# Patient Record
Sex: Female | Born: 1965 | Race: White | Hispanic: No | Marital: Married | Smoking: Never smoker
Health system: Southern US, Community
[De-identification: ages and names within clinical notes are randomized; demographics above are authoritative.]

## PROBLEM LIST (undated history)

## (undated) DIAGNOSIS — T7840XA Allergy, unspecified, initial encounter: Secondary | ICD-10-CM

## (undated) DIAGNOSIS — K219 Gastro-esophageal reflux disease without esophagitis: Secondary | ICD-10-CM

## (undated) DIAGNOSIS — C801 Malignant (primary) neoplasm, unspecified: Secondary | ICD-10-CM

## (undated) DIAGNOSIS — E785 Hyperlipidemia, unspecified: Secondary | ICD-10-CM

## (undated) HISTORY — DX: Gastro-esophageal reflux disease without esophagitis: K21.9

## (undated) HISTORY — DX: Allergy, unspecified, initial encounter: T78.40XA

## (undated) HISTORY — DX: Hyperlipidemia, unspecified: E78.5

## (undated) HISTORY — DX: Malignant (primary) neoplasm, unspecified: C80.1

---

## 2008-11-10 HISTORY — PX: SKIN CANCER EXCISION: SHX779

## 2013-07-06 ENCOUNTER — Other Ambulatory Visit: Payer: Self-pay | Admitting: Nurse Practitioner

## 2013-07-07 NOTE — Telephone Encounter (Signed)
Last seen 12/11/12  [facility] 

## 2013-07-28 ENCOUNTER — Telehealth: Payer: Self-pay | Admitting: Nurse Practitioner

## 2013-07-28 ENCOUNTER — Other Ambulatory Visit: Payer: Self-pay | Admitting: Nurse Practitioner

## 2013-07-28 MED ORDER — HYDROXYZINE HCL 25 MG PO TABS: 25.0000 mg | ORAL_TABLET | Freq: Three times a day (TID) | ORAL | Status: DC | PRN

## 2013-07-28 NOTE — Telephone Encounter (Signed)
Will send in rx for hydroxizine- can make you sleepy

## 2013-07-28 NOTE — Telephone Encounter (Signed)
Patient aware.

## 2013-08-08 ENCOUNTER — Other Ambulatory Visit: Payer: Self-pay

## 2013-08-08 MED ORDER — LORATADINE 10 MG PO TABS: 10.0000 mg | ORAL_TABLET | Freq: Every day | ORAL | Status: DC

## 2013-08-08 NOTE — Telephone Encounter (Signed)
Last seen 12/11/12  [facility]

## 2013-09-07 ENCOUNTER — Other Ambulatory Visit: Payer: Self-pay | Admitting: *Deleted

## 2013-09-07 MED ORDER — LORATADINE 10 MG PO TABS: 10.0000 mg | ORAL_TABLET | Freq: Every day | ORAL | Status: DC

## 2013-09-07 NOTE — Telephone Encounter (Signed)
LAST OV 2/14

## 2013-10-11 ENCOUNTER — Other Ambulatory Visit: Payer: Self-pay | Admitting: Nurse Practitioner

## 2013-10-13 NOTE — Telephone Encounter (Signed)
Last seen by [facility] 02/14

## 2013-11-12 ENCOUNTER — Other Ambulatory Visit: Payer: Self-pay | Admitting: Nurse Practitioner

## 2013-11-18 ENCOUNTER — Other Ambulatory Visit: Payer: Self-pay | Admitting: Nurse Practitioner

## 2013-11-21 ENCOUNTER — Encounter (INDEPENDENT_AMBULATORY_CARE_PROVIDER_SITE_OTHER): Payer: Self-pay

## 2013-11-21 ENCOUNTER — Ambulatory Visit (INDEPENDENT_AMBULATORY_CARE_PROVIDER_SITE_OTHER): Admitting: Family Medicine

## 2013-11-21 ENCOUNTER — Encounter: Payer: Self-pay | Admitting: Family Medicine

## 2013-11-21 VITALS — BP 120/81 | HR 88 | Temp 98.7°F | Wt 142.4 lb

## 2013-11-21 DIAGNOSIS — J029 Acute pharyngitis, unspecified: Secondary | ICD-10-CM

## 2013-11-21 DIAGNOSIS — J209 Acute bronchitis, unspecified: Secondary | ICD-10-CM

## 2013-11-21 DIAGNOSIS — R05 Cough: Secondary | ICD-10-CM

## 2013-11-21 DIAGNOSIS — R059 Cough, unspecified: Secondary | ICD-10-CM

## 2013-11-21 LAB — POCT INFLUENZA A/B
Influenza A, POC: NEGATIVE
Influenza B, POC: NEGATIVE

## 2013-11-21 LAB — POCT RAPID STREP A (OFFICE): Rapid Strep A Screen: NEGATIVE

## 2013-11-21 MED ORDER — GUAIFENESIN-CODEINE 100-10 MG/5ML PO SYRP: 5.0000 mL | ORAL_SOLUTION | Freq: Three times a day (TID) | ORAL | Status: DC | PRN

## 2013-11-21 MED ORDER — AZITHROMYCIN 250 MG PO TABS: ORAL_TABLET | ORAL | Status: DC

## 2013-11-21 NOTE — Patient Instructions (Signed)
Acute Bronchitis Bronchitis is inflammation of the airways that extend from the windpipe into the lungs (bronchi). The inflammation often causes mucus to develop. This leads to a cough, which is the most common symptom of bronchitis.  In acute bronchitis, the condition usually develops suddenly and goes away over time, usually in a couple weeks. Smoking, allergies, and asthma can make bronchitis worse. Repeated episodes of bronchitis may cause further lung problems.  CAUSES Acute bronchitis is most often caused by the same virus that causes a cold. The virus can spread from person to person (contagious).  SIGNS AND SYMPTOMS   Cough.   Fever.   Coughing up mucus.   Body aches.   Chest congestion.   Chills.   Shortness of breath.   Sore throat.  DIAGNOSIS  Acute bronchitis is usually diagnosed through a physical exam. Tests, such as chest X-rays, are sometimes done to rule out other conditions.  TREATMENT  Acute bronchitis usually goes away in a couple weeks. Often times, no medical treatment is necessary. Medicines are sometimes given for relief of fever or cough. Antibiotics are usually not needed but may be prescribed in certain situations. In some cases, an inhaler may be recommended to help reduce shortness of breath and control the cough. A cool mist vaporizer may also be used to help thin bronchial secretions and make it easier to clear the chest.  HOME CARE INSTRUCTIONS  Get plenty of rest.   Drink enough fluids to keep your urine clear or pale yellow (unless you have a medical condition that requires fluid restriction). Increasing fluids may help thin your secretions and will prevent dehydration.   Only take over-the-counter or prescription medicines as directed by your health care provider.   Avoid smoking and secondhand smoke. Exposure to cigarette smoke or irritating chemicals will make bronchitis worse. If you are a smoker, consider using nicotine gum or skin  patches to help control withdrawal symptoms. Quitting smoking will help your lungs heal faster.   Reduce the chances of another bout of acute bronchitis by washing your hands frequently, avoiding people with cold symptoms, and trying not to touch your hands to your mouth, nose, or eyes.   Follow up with your health care provider as directed.  SEEK MEDICAL CARE IF: Your symptoms do not improve after 1 week of treatment.  SEEK IMMEDIATE MEDICAL CARE IF:  You develop an increased fever or chills.   You have chest pain.   You have severe shortness of breath.  You have bloody sputum.   You develop dehydration.  You develop fainting.  You develop repeated vomiting.  You develop a severe headache. MAKE SURE YOU:   Understand these instructions.  Will watch your condition.  Will get help right away if you are not doing well or get worse. Document Released: 12/04/2004 Document Revised: 06/29/2013 Document Reviewed: 04/19/2013 [employer] Patient Information 2014 [employer].  

## 2013-11-21 NOTE — Progress Notes (Signed)
Patient ID: Nicole Benitez, female   DOB: [birth year], 48 y.o.   MRN: [id number] SUBJECTIVE: CC: Chief Complaint  Patient presents with  . Acute Visit    cough sore throat     HPI: Was in the [place] hospital with her husband who went to get his  Dental care. Sore Throat Patient complains of sore throat. Associated symptoms include chest congestion, hoarseness, nasal blockage, post nasal drip, sinus and nasal congestion and sore throat. Onset of symptoms was 2 days ago, and have been gradually worsening since that time. She is drinking plenty of fluids. She has not had recent close exposure to someone with proven streptococcal pharyngitis. Doesn't smoke. Husband smokes but outside.   Past Medical History  Diagnosis Date  . Allergy     seasonal   Past Surgical History  Procedure Laterality Date  . Skin cancer excision Right 11-10-2008    skin cancer rt shoulder   History   Social History  . Marital Status: Married    Spouse Name: N/A    Number of Children: N/A  . Years of Education: N/A   Occupational History  . Not on file.   Social History Main Topics  . Smoking status: Never Smoker   . Smokeless tobacco: Not on file  . Alcohol Use: Not on file  . Drug Use: Not on file  . Sexual Activity: Not on file   Other Topics Concern  . Not on file   Social History Narrative  . No narrative on file   Family History  Problem Relation Age of Onset  . Hyperlipidemia Mother   . Hypertension Mother   . Cancer Father     back cancer  . Hypertension Brother    Current Outpatient Prescriptions on File Prior to Visit  Medication Sig Dispense Refill  . EQ ALLERGY RELIEF 10 MG tablet TAKE ONE TABLET BY MOUTH ONCE DAILY  30 tablet  0  . hydrOXYzine (ATARAX/VISTARIL) 25 MG tablet Take 1 tablet (25 mg total) by mouth 3 (three) times daily as needed for itching.  30 tablet  0   No current facility-administered medications on file prior to visit.   No Known Allergies  There is no  immunization history on file for this patient. Prior to Admission medications   Medication Sig Start Date End Date Taking? Authorizing Provider  EQ ALLERGY RELIEF 10 MG tablet TAKE ONE TABLET BY MOUTH ONCE DAILY 11/18/13  Yes Mary-Margaret Hassell Done, FNP  hydrOXYzine (ATARAX/VISTARIL) 25 MG tablet Take 1 tablet (25 mg total) by mouth 3 (three) times daily as needed for itching. 07/28/13  Yes Mary-Margaret Hassell Done, FNP     ROS: As above in the HPI. All other systems are stable or negative.  OBJECTIVE: APPEARANCE:  Patient in no acute distress.The patient appeared well nourished and normally developed. Acyanotic. Waist: VITAL SIGNS:BP 120/81  Pulse 88  Temp(Src) 98.7 F (37.1 C) (Oral)  Wt 142 lb 6.4 oz (64.592 kg)  SpO2 99%  LMP 10/31/2013 [facility]  SKIN: warm and  Dry without overt rashes, tattoos and scars  HEAD and Neck: without JVD, Head and scalp: normal Eyes:No scleral icterus. Fundi normal, eye movements normal. Ears: Auricle normal, canal normal, Tympanic membranes normal, insufflation normal. Nose: nasal congestion. Throat: red Neck & thyroid: normal  CHEST & LUNGS: Chest wall: normal Lungs: Coarse breath sounds. No Rales. No wheezes.   CVS: Reveals the PMI to be normally located. Regular rhythm, First and Second Heart sounds are normal,  absence of murmurs,  rubs or gallops. Peripheral vasculature: Radial pulses: normal Dorsal pedis pulses: normal Posterior pulses: normal  ABDOMEN:  Appearance: normal Benign, no organomegaly, no masses, no Abdominal Aortic enlargement. No Guarding , no rebound. No Bruits. Bowel sounds: normal  RECTAL: N/A GU: N/A  EXTREMETIES: nonedematous.  MUSCULOSKELETAL:  Spine: thoracolumbar scoliosis. With hump right thorax. Joints: intact  NEUROLOGIC: oriented to time,place and person; nonfocal. Strength is normal Sensory is normal Reflexes are normal Cranial Nerves are normal.  ASSESSMENT:  Sore throat - Plan: POCT Influenza  A/B, POCT rapid strep A  Cough - Plan: POCT Influenza A/B, POCT rapid strep A  Acute bronchitis - Plan: azithromycin (ZITHROMAX Z-PAK) 250 MG tablet, guaiFENesin-codeine (ROBITUSSIN AC) 100-10 MG/5ML syrup  PLAN: Hand out on bronchitis given Handwashing.   Orders Placed This Encounter  Procedures  . POCT Influenza A/B  . POCT rapid strep A   Results for orders placed in visit on 11/21/13  POCT INFLUENZA A/B      Result Value Range   Influenza A, POC Negative     Influenza B, POC Negative    POCT RAPID STREP A (OFFICE)      Result Value Range   Rapid Strep A Screen Negative  Negative    Meds ordered this encounter  Medications  . azithromycin (ZITHROMAX Z-PAK) 250 MG tablet    Sig: 2 tabs day 1 then 1 tab days 2 to 5    Dispense:  6 each    Refill:  0  . guaiFENesin-codeine (ROBITUSSIN AC) 100-10 MG/5ML syrup    Sig: Take 5 mLs by mouth 3 (three) times daily as needed for cough.    Dispense:  120 mL    Refill:  0   There are no discontinued medications. Return if symptoms worsen or fail to improve.  Zaelynn Fuchs P. Jacelyn Grip, M.D.

## 2013-12-27 ENCOUNTER — Other Ambulatory Visit: Payer: Self-pay | Admitting: Nurse Practitioner

## 2014-01-06 ENCOUNTER — Other Ambulatory Visit: Payer: Self-pay | Admitting: Nurse Practitioner

## 2014-01-16 ENCOUNTER — Other Ambulatory Visit: Payer: Self-pay

## 2014-01-16 MED ORDER — LORATADINE 10 MG PO TABS: 10.0000 mg | ORAL_TABLET | Freq: Every day | ORAL | Status: DC

## 2014-08-02 ENCOUNTER — Other Ambulatory Visit: Payer: Self-pay

## 2014-08-02 MED ORDER — LORATADINE 10 MG PO TABS: 10.0000 mg | ORAL_TABLET | Freq: Every day | ORAL | Status: DC

## 2014-08-02 NOTE — Telephone Encounter (Signed)
Last seen 11/21/13  [facility]

## 2014-09-06 ENCOUNTER — Other Ambulatory Visit: Payer: Self-pay | Admitting: Family Medicine

## 2014-09-07 NOTE — Telephone Encounter (Signed)
Last seen 11/21/13  [facility]

## 2014-10-15 ENCOUNTER — Other Ambulatory Visit: Payer: Self-pay | Admitting: Family Medicine

## 2014-10-16 NOTE — Telephone Encounter (Signed)
Last ov 1/15. 

## 2014-11-28 ENCOUNTER — Other Ambulatory Visit: Payer: Self-pay | Admitting: Family Medicine

## 2015-09-11 ENCOUNTER — Encounter: Payer: Self-pay | Admitting: Family

## 2015-09-11 ENCOUNTER — Ambulatory Visit (INDEPENDENT_AMBULATORY_CARE_PROVIDER_SITE_OTHER): Admitting: Family

## 2015-09-11 VITALS — BP 129/76 | HR 113 | Temp 97.9°F | Ht 64.0 in | Wt 132.6 lb

## 2015-09-11 DIAGNOSIS — N63 Unspecified lump in breast: Secondary | ICD-10-CM

## 2015-09-11 DIAGNOSIS — Z23 Encounter for immunization: Secondary | ICD-10-CM

## 2015-09-11 DIAGNOSIS — Z0000 Encounter for general adult medical examination without abnormal findings: Secondary | ICD-10-CM

## 2015-09-11 DIAGNOSIS — Z1211 Encounter for screening for malignant neoplasm of colon: Secondary | ICD-10-CM | POA: Diagnosis not present

## 2015-09-11 DIAGNOSIS — Z01419 Encounter for gynecological examination (general) (routine) without abnormal findings: Secondary | ICD-10-CM

## 2015-09-11 DIAGNOSIS — N6310 Unspecified lump in the right breast, unspecified quadrant: Secondary | ICD-10-CM

## 2015-09-11 DIAGNOSIS — Z1159 Encounter for screening for other viral diseases: Secondary | ICD-10-CM

## 2015-09-11 DIAGNOSIS — N926 Irregular menstruation, unspecified: Secondary | ICD-10-CM

## 2015-09-11 LAB — POCT URINALYSIS DIPSTICK
Bilirubin, UA: NEGATIVE
Blood, UA: NEGATIVE
GLUCOSE UA: NEGATIVE
Leukocytes, UA: NEGATIVE
NITRITE UA: NEGATIVE
Protein, UA: NEGATIVE
Spec Grav, UA: 1.025
UROBILINOGEN UA: NEGATIVE
pH, UA: 5

## 2015-09-11 LAB — POCT UA - MICROSCOPIC ONLY
Bacteria, U Microscopic: NEGATIVE
CRYSTALS, UR, HPF, POC: NEGATIVE
Casts, Ur, LPF, POC: NEGATIVE
RBC, urine, microscopic: NEGATIVE
WBC, UR, HPF, POC: NEGATIVE
YEAST UA: NEGATIVE

## 2015-09-11 NOTE — Progress Notes (Signed)
Subjective:    Patient ID: Nicole Benitez, female    DOB: September 02, 1966, 49 y.o.   MRN: [id number]  HPI Pt presents to the office today for CPE with pap. Pt currently not taking medications at this time. Pt states she has a knot on right breast. Pt states she hit her breast on a door about two weeks ago and since has noticed a "knot". Pt states she is having a slight discomfort in her right nipple of 1 out 10. Pt states she has a negative mammogram at the first of August. Pt states it is intermittent soreness. Pt denies any headache, palpitations, SOB, or edema at this time.     Review of Systems  Constitutional: Negative.   HENT: Negative.   Eyes: Negative.   Respiratory: Negative.  Negative for shortness of breath.   Cardiovascular: Negative.  Negative for palpitations.  Gastrointestinal: Negative.   Endocrine: Negative.   Genitourinary: Negative.   Musculoskeletal: Negative.   Neurological: Negative.  Negative for headaches.  Hematological: Negative.   Psychiatric/Behavioral: Negative.   All other systems reviewed and are negative.      Objective:   Physical Exam  Constitutional: She is oriented to person, place, and time. She appears well-developed and well-nourished. No distress.  HENT:  Head: Normocephalic and atraumatic.  Right Ear: External ear normal.  Left Ear: External ear normal.  Nose: Nose normal.  Mouth/Throat: Oropharynx is clear and moist.  Eyes: Pupils are equal, round, and reactive to light.  Neck: Normal range of motion. Neck supple. No thyromegaly present.  Cardiovascular: Normal rate, regular rhythm, normal heart sounds and intact distal pulses.   No murmur heard. Pulmonary/Chest: Effort normal and breath sounds normal. No respiratory distress. She has no wheezes. Right breast exhibits inverted nipple and mass (hard nonmoveable mass under right nipple). Right breast exhibits no nipple discharge, no skin change and no tenderness. Left breast exhibits no  inverted nipple, no mass, no nipple discharge, no skin change and no tenderness.  Abdominal: Soft. Bowel sounds are normal. She exhibits no distension. There is no tenderness.  Genitourinary: Vagina normal.  Bimanual exam- no adnexal masses or tenderness, ovaries nonpalpable   Cervix parous and pink- No discharge   Musculoskeletal: Normal range of motion. She exhibits no edema or tenderness.  Neurological: She is alert and oriented to person, place, and time. She has normal reflexes. No cranial nerve deficit.  Skin: Skin is warm and dry.  Psychiatric: She has a normal mood and affect. Her behavior is normal. Judgment and thought content normal.  Vitals reviewed.   BP 129/76 mmHg  Pulse 113  Temp(Src) 97.9 F (36.6 C) (Oral)  Ht 5' 4" (1.626 m)  Wt 132 lb 9.6 oz (60.147 kg)  BMI 22.75 kg/m2  LMP 09/04/2015 (Approximate)       Assessment & Plan:  1. Encounter for routine gynecological examination - POCT urinalysis dipstick - POCT UA - Microscopic Only - Pap IG w/ reflex to HPV when ASC-U  2. Annual physical exam - Anemia Profile B - CMP14+EGFR - Lipid panel - Thyroid Panel With TSH - Vit D  25 hydroxy (rtn osteoporosis monitoring) - Hepatitis C antibody - Pap IG w/ reflex to HPV when ASC-U  3. Need for hepatitis C screening test - Hepatitis C antibody  4. Irregular menses - FSH/LH  5. Encounter for screening colonoscopy - Ambulatory referral to Gastroenterology  6. Mass of breast, right - MM Digital Diagnostic Unilat R; Future   Continue all  meds Labs pending Health Maintenance reviewed- TDAP given today Diet and exercise encouraged RTO 1 year  Christy Hawks, FNP   

## 2015-09-11 NOTE — Patient Instructions (Signed)
Health Maintenance, Female Adopting a healthy lifestyle and getting preventive care can go a long way to promote health and wellness. Talk with your health care provider about what schedule of regular examinations is right for you. This is a good chance for you to check in with your provider about disease prevention and staying healthy. In between checkups, there are plenty of things you can do on your own. Experts have done a lot of research about which lifestyle changes and preventive measures are most likely to keep you healthy. Ask your health care provider for more information. WEIGHT AND DIET  Eat a healthy diet  Be sure to include plenty of vegetables, fruits, low-fat dairy products, and lean protein.  Do not eat a lot of foods high in solid fats, added sugars, or salt.  Get regular exercise. This is one of the most important things you can do for your health.  Most adults should exercise for at least 150 minutes each week. The exercise should increase your heart rate and make you sweat (moderate-intensity exercise).  Most adults should also do strengthening exercises at least twice a week. This is in addition to the moderate-intensity exercise.  Maintain a healthy weight  Body mass index (BMI) is a measurement that can be used to identify possible weight problems. It estimates body fat based on height and weight. Your health care provider can help determine your BMI and help you achieve or maintain a healthy weight.  For females 20 years of age and older:   A BMI below 18.5 is considered underweight.  A BMI of 18.5 to 24.9 is normal.  A BMI of 25 to 29.9 is considered overweight.  A BMI of 30 and above is considered obese.  Watch levels of cholesterol and blood lipids  You should start having your blood tested for lipids and cholesterol at 51 years of age, then have this test every 5 years.  You may need to have your cholesterol levels checked more often if:  Your lipid  or cholesterol levels are high.  You are older than 50 years of age.  You are at high risk for heart disease.  CANCER SCREENING   Lung Cancer  Lung cancer screening is recommended for adults 55-80 years old who are at high risk for lung cancer because of a history of smoking.  A yearly low-dose CT scan of the lungs is recommended for people who:  Currently smoke.  Have quit within the past 15 years.  Have at least a 30-pack-year history of smoking. A pack year is smoking an average of one pack of cigarettes a day for 1 year.  Yearly screening should continue until it has been 15 years since you quit.  Yearly screening should stop if you develop a health problem that would prevent you from having lung cancer treatment.  Breast Cancer  Practice breast self-awareness. This means understanding how your breasts normally appear and feel.  It also means doing regular breast self-exams. Let your health care provider know about any changes, no matter how small.  If you are in your 20s or 30s, you should have a clinical breast exam (CBE) by a health care provider every 1-3 years as part of a regular health exam.  If you are 40 or older, have a CBE every year. Also consider having a breast X-ray (mammogram) every year.  If you have a family history of breast cancer, talk to your health care provider about genetic screening.  If you   are at high risk for breast cancer, talk to your health care provider about having an MRI and a mammogram every year.  Breast cancer gene (BRCA) assessment is recommended for women who have family members with BRCA-related cancers. BRCA-related cancers include:  Breast.  Ovarian.  Tubal.  Peritoneal cancers.  Results of the assessment will determine the need for genetic counseling and BRCA1 and BRCA2 testing. Cervical Cancer Your health care provider may recommend that you be screened regularly for cancer of the pelvic organs (ovaries, uterus, and  vagina). This screening involves a pelvic examination, including checking for microscopic changes to the surface of your cervix (Pap test). You may be encouraged to have this screening done every 3 years, beginning at age 21.  For women ages 30-65, health care providers may recommend pelvic exams and Pap testing every 3 years, or they may recommend the Pap and pelvic exam, combined with testing for human papilloma virus (HPV), every 5 years. Some types of HPV increase your risk of cervical cancer. Testing for HPV may also be done on women of any age with unclear Pap test results.  Other health care providers may not recommend any screening for nonpregnant women who are considered low risk for pelvic cancer and who do not have symptoms. Ask your health care provider if a screening pelvic exam is right for you.  If you have had past treatment for cervical cancer or a condition that could lead to cancer, you need Pap tests and screening for cancer for at least 20 years after your treatment. If Pap tests have been discontinued, your risk factors (such as having a new sexual partner) need to be reassessed to determine if screening should resume. Some women have medical problems that increase the chance of getting cervical cancer. In these cases, your health care provider may recommend more frequent screening and Pap tests. Colorectal Cancer  This type of cancer can be detected and often prevented.  Routine colorectal cancer screening usually begins at 50 years of age and continues through 51 years of age.  Your health care provider may recommend screening at an earlier age if you have risk factors for colon cancer.  Your health care provider may also recommend using home test kits to check for hidden blood in the stool.  A small camera at the end of a tube can be used to examine your colon directly (sigmoidoscopy or colonoscopy). This is done to check for the earliest forms of colorectal  cancer.  Routine screening usually begins at age 50.  Direct examination of the colon should be repeated every 5-10 years through 51 years of age. However, you may need to be screened more often if early forms of precancerous polyps or small growths are found. Skin Cancer  Check your skin from head to toe regularly.  Tell your health care provider about any new moles or changes in moles, especially if there is a change in a mole's shape or color.  Also tell your health care provider if you have a mole that is larger than the size of a pencil eraser.  Always use sunscreen. Apply sunscreen liberally and repeatedly throughout the day.  Protect yourself by wearing long sleeves, pants, a wide-brimmed hat, and sunglasses whenever you are outside. HEART DISEASE, DIABETES, AND HIGH BLOOD PRESSURE   High blood pressure causes heart disease and increases the risk of stroke. High blood pressure is more likely to develop in:  People who have blood pressure in the high end   of the normal range (130-139/85-89 mm Hg).  People who are overweight or obese.  People who are African American.  If you are 38-23 years of age, have your blood pressure checked every 3-5 years. If you are 61 years of age or older, have your blood pressure checked every year. You should have your blood pressure measured twice--once when you are at a hospital or clinic, and once when you are not at a hospital or clinic. Record the average of the two measurements. To check your blood pressure when you are not at a hospital or clinic, you can use:  An automated blood pressure machine at a pharmacy.  A home blood pressure monitor.  If you are between 45 years and 39 years old, ask your health care provider if you should take aspirin to prevent strokes.  Have regular diabetes screenings. This involves taking a blood sample to check your fasting blood sugar level.  If you are at a normal weight and have a low risk for diabetes,  have this test once every three years after 51 years of age.  If you are overweight and have a high risk for diabetes, consider being tested at a younger age or more often. PREVENTING INFECTION  Hepatitis B  If you have a higher risk for hepatitis B, you should be screened for this virus. You are considered at high risk for hepatitis B if:  You were born in a country where hepatitis B is common. Ask your health care provider which countries are considered high risk.  Your parents were born in a high-risk country, and you have not been immunized against hepatitis B (hepatitis B vaccine).  You have HIV or AIDS.  You use needles to inject street drugs.  You live with someone who has hepatitis B.  You have had sex with someone who has hepatitis B.  You get hemodialysis treatment.  You take certain medicines for conditions, including cancer, organ transplantation, and autoimmune conditions. Hepatitis C  Blood testing is recommended for:  Everyone born from [birth year] through 1967.  Anyone with known risk factors for hepatitis C. Sexually transmitted infections (STIs)  You should be screened for sexually transmitted infections (STIs) including gonorrhea and chlamydia if:  You are sexually active and are younger than 51 years of age.  You are older than 51 years of age and your health care provider tells you that you are at risk for this type of infection.  Your sexual activity has changed since you were last screened and you are at an increased risk for chlamydia or gonorrhea. Ask your health care provider if you are at risk.  If you do not have HIV, but are at risk, it may be recommended that you take a prescription medicine daily to prevent HIV infection. This is called pre-exposure prophylaxis (PrEP). You are considered at risk if:  You are sexually active and do not regularly use condoms or know the HIV status of your partner(s).  You take drugs by injection.  You are sexually  active with a partner who has HIV. Talk with your health care provider about whether you are at high risk of being infected with HIV. If you choose to begin PrEP, you should first be tested for HIV. You should then be tested every 3 months for as long as you are taking PrEP.  PREGNANCY   If you are premenopausal and you may become pregnant, ask your health care provider about preconception counseling.  If you may  become pregnant, take 400 to 800 micrograms (mcg) of folic acid every day.  If you want to prevent pregnancy, talk to your health care provider about birth control (contraception). OSTEOPOROSIS AND MENOPAUSE   Osteoporosis is a disease in which the bones lose minerals and strength with aging. This can result in serious bone fractures. Your risk for osteoporosis can be identified using a bone density scan.  If you are 61 years of age or older, or if you are at risk for osteoporosis and fractures, ask your health care provider if you should be screened.  Ask your health care provider whether you should take a calcium or vitamin D supplement to lower your risk for osteoporosis.  Menopause may have certain physical symptoms and risks.  Hormone replacement therapy may reduce some of these symptoms and risks. Talk to your health care provider about whether hormone replacement therapy is right for you.  HOME CARE INSTRUCTIONS   Schedule regular health, dental, and eye exams.  Stay current with your immunizations.   Do not use any tobacco products including cigarettes, chewing tobacco, or electronic cigarettes.  If you are pregnant, do not drink alcohol.  If you are breastfeeding, limit how much and how often you drink alcohol.  Limit alcohol intake to no more than 1 drink per day for nonpregnant women. One drink equals 12 ounces of beer, 5 ounces of wine, or 1 ounces of hard liquor.  Do not use street drugs.  Do not share needles.  Ask your health care provider for help if  you need support or information about quitting drugs.  Tell your health care provider if you often feel depressed.  Tell your health care provider if you have ever been abused or do not feel safe at home.   This information is not intended to replace advice given to you by your health care provider. Make sure you discuss any questions you have with your health care provider.   Document Released: 05/12/2011 Document Revised: 11/17/2014 Document Reviewed: 09/28/2013 Elsevier Interactive Patient Education [employer].

## 2015-09-12 ENCOUNTER — Encounter: Payer: Self-pay | Admitting: Gastroenterology

## 2015-09-12 LAB — CMP14+EGFR
ALBUMIN: 4.9 g/dL (ref 3.5–5.5)
ALT: 17 IU/L (ref 0–32)
AST: 21 IU/L (ref 0–40)
Albumin/Globulin Ratio: 1.7 (ref 1.1–2.5)
Alkaline Phosphatase: 87 IU/L (ref 39–117)
BUN / CREAT RATIO: 18 (ref 9–23)
BUN: 14 mg/dL (ref 6–24)
Bilirubin Total: 0.5 mg/dL (ref 0.0–1.2)
CALCIUM: 10 mg/dL (ref 8.7–10.2)
CO2: 22 mmol/L (ref 18–29)
CREATININE: 0.76 mg/dL (ref 0.57–1.00)
Chloride: 99 mmol/L (ref 97–106)
GFR calc Af Amer: 105 mL/min/{1.73_m2} (ref >59)
GFR, EST NON AFRICAN AMERICAN: 91 mL/min/{1.73_m2} (ref >59)
GLOBULIN, TOTAL: 2.9 g/dL (ref 1.5–4.5)
Glucose: 103 mg/dL — ABNORMAL HIGH (ref 65–99)
Potassium: 4 mmol/L (ref 3.5–5.2)
SODIUM: 142 mmol/L (ref 136–144)
Total Protein: 7.8 g/dL (ref 6.0–8.5)

## 2015-09-12 LAB — LIPID PANEL
CHOL/HDL RATIO: 2.8 ratio (ref 0.0–4.4)
Cholesterol, Total: 220 mg/dL — ABNORMAL HIGH (ref 100–199)
HDL: 80 mg/dL (ref >39)
LDL CALC: 123 mg/dL — AB (ref 0–99)
TRIGLYCERIDES: 83 mg/dL (ref 0–149)
VLDL CHOLESTEROL CAL: 17 mg/dL (ref 5–40)

## 2015-09-12 LAB — ANEMIA PROFILE B
BASOS: 0 %
Basophils Absolute: 0 10*3/uL (ref 0.0–0.2)
EOS (ABSOLUTE): 0 10*3/uL (ref 0.0–0.4)
Eos: 0 %
Ferritin: 364 ng/mL — ABNORMAL HIGH (ref 15–150)
Folate: >20 ng/mL (ref >3.0)
HEMATOCRIT: 41.1 % (ref 34.0–46.6)
Hemoglobin: 14.5 g/dL (ref 11.1–15.9)
IMMATURE GRANS (ABS): 0 10*3/uL (ref 0.0–0.1)
IMMATURE GRANULOCYTES: 0 %
IRON SATURATION: 20 % (ref 15–55)
IRON: 57 ug/dL (ref 27–159)
Lymphocytes Absolute: 1.7 10*3/uL (ref 0.7–3.1)
Lymphs: 22 %
MCH: 34.4 pg — ABNORMAL HIGH (ref 26.6–33.0)
MCHC: 35.3 g/dL (ref 31.5–35.7)
MCV: 97 fL (ref 79–97)
Monocytes Absolute: 0.5 10*3/uL (ref 0.1–0.9)
Monocytes: 7 %
NEUTROS ABS: 5.5 10*3/uL (ref 1.4–7.0)
Neutrophils: 71 %
PLATELETS: 294 10*3/uL (ref 150–379)
RBC: 4.22 x10E6/uL (ref 3.77–5.28)
RDW: 12.9 % (ref 12.3–15.4)
RETIC CT PCT: 1.4 % (ref 0.6–2.6)
Total Iron Binding Capacity: 282 ug/dL (ref 250–450)
UIBC: 225 ug/dL (ref 131–425)
Vitamin B-12: 1008 pg/mL — ABNORMAL HIGH (ref 211–946)
WBC: 7.8 10*3/uL (ref 3.4–10.8)

## 2015-09-12 LAB — THYROID PANEL WITH TSH
FREE THYROXINE INDEX: 2.6 (ref 1.2–4.9)
T3 Uptake Ratio: 25 % (ref 24–39)
T4 TOTAL: 10.5 ug/dL (ref 4.5–12.0)
TSH: 1.65 u[IU]/mL (ref 0.450–4.500)

## 2015-09-12 LAB — VITAMIN D 25 HYDROXY (VIT D DEFICIENCY, FRACTURES): VIT D 25 HYDROXY: 25 ng/mL — AB (ref 30.0–100.0)

## 2015-09-12 LAB — FSH/LH
FSH: 69.4 m[IU]/mL
LH: 25.7 m[IU]/mL

## 2015-09-12 LAB — HEPATITIS C ANTIBODY: HEP C VIRUS AB: 1.7 {s_co_ratio} — AB (ref 0.0–0.9)

## 2015-09-13 ENCOUNTER — Other Ambulatory Visit: Payer: Self-pay | Admitting: Family

## 2015-09-13 DIAGNOSIS — N6310 Unspecified lump in the right breast, unspecified quadrant: Secondary | ICD-10-CM

## 2015-09-13 DIAGNOSIS — E785 Hyperlipidemia, unspecified: Secondary | ICD-10-CM

## 2015-09-13 DIAGNOSIS — R768 Other specified abnormal immunological findings in serum: Secondary | ICD-10-CM

## 2015-09-13 DIAGNOSIS — E559 Vitamin D deficiency, unspecified: Secondary | ICD-10-CM

## 2015-09-13 LAB — PAP IG W/ RFLX HPV ASCU: PAP SMEAR COMMENT: 0

## 2015-09-13 MED ORDER — SIMVASTATIN 20 MG PO TABS: 20.0000 mg | ORAL_TABLET | Freq: Every day | ORAL | Status: DC

## 2015-09-13 MED ORDER — VITAMIN D (ERGOCALCIFEROL) 1.25 MG (50000 UNIT) PO CAPS: 50000.0000 [IU] | ORAL_CAPSULE | ORAL | Status: DC

## 2015-09-18 ENCOUNTER — Ambulatory Visit
Admission: RE | Admit: 2015-09-18 | Discharge: 2015-09-18 | Disposition: A | Discharge status: Home / Self Care | Source: Ambulatory Visit | Attending: Family | Admitting: Family

## 2015-09-18 ENCOUNTER — Other Ambulatory Visit: Payer: Self-pay | Admitting: Family

## 2015-09-18 DIAGNOSIS — N6310 Unspecified lump in the right breast, unspecified quadrant: Secondary | ICD-10-CM

## 2015-09-19 ENCOUNTER — Telehealth: Payer: Self-pay | Admitting: Family

## 2015-09-19 DIAGNOSIS — R768 Other specified abnormal immunological findings in serum: Secondary | ICD-10-CM

## 2015-09-20 NOTE — Telephone Encounter (Signed)
Referral to GI sent for positive Hep C

## 2015-09-25 ENCOUNTER — Encounter (HOSPITAL_COMMUNITY): Payer: Self-pay

## 2015-09-28 ENCOUNTER — Telehealth: Payer: Self-pay | Admitting: Family Medicine

## 2015-10-03 NOTE — Telephone Encounter (Signed)
Referral placed and appointment made 

## 2015-11-06 ENCOUNTER — Ambulatory Visit (AMBULATORY_SURGERY_CENTER): Payer: TRICARE For Life (TFL) | Admitting: *Deleted

## 2015-11-06 VITALS — Ht 64.0 in | Wt 137.4 lb

## 2015-11-06 DIAGNOSIS — Z1211 Encounter for screening for malignant neoplasm of colon: Secondary | ICD-10-CM

## 2015-11-06 MED ORDER — NA SULFATE-K SULFATE-MG SULF 17.5-3.13-1.6 GM/177ML PO SOLN: 1.0000 | Freq: Once | ORAL | Status: DC

## 2015-11-06 NOTE — Progress Notes (Signed)
Unsure if mother had polyps. she will verify and let us know day of colon   No egg or soy allergy known to patient  No past sedation with any procedures- no past surgeries with sedation No diet pills per patient  No home 02 use per patient   emmi video to email        [email or web address]

## 2015-11-20 ENCOUNTER — Encounter: Payer: Self-pay | Admitting: Gastroenterology

## 2015-11-20 ENCOUNTER — Ambulatory Visit (AMBULATORY_SURGERY_CENTER): Admitting: Gastroenterology

## 2015-11-20 VITALS — BP 102/67 | HR 71 | Temp 98.2°F | Resp 17 | Ht 64.0 in | Wt 137.0 lb

## 2015-11-20 DIAGNOSIS — Z1211 Encounter for screening for malignant neoplasm of colon: Secondary | ICD-10-CM | POA: Diagnosis not present

## 2015-11-20 MED ORDER — SODIUM CHLORIDE 0.9 % IV SOLN: 500.0000 mL | INTRAVENOUS | Status: DC

## 2015-11-20 NOTE — Progress Notes (Signed)
Stable to RR 

## 2015-11-20 NOTE — Patient Instructions (Signed)
YOU HAD AN ENDOSCOPIC PROCEDURE TODAY AT THE [facility]:   Refer to the procedure report that was given to you for any specific questions about what was found during the examination.  If the procedure report does not answer your questions, please call your gastroenterologist to clarify.  If you requested that your care partner not be given the details of your procedure findings, then the procedure report has been included in a sealed envelope for you to review at your convenience later.  YOU SHOULD EXPECT: Some feelings of bloating in the abdomen. Passage of more gas than usual.  Walking can help get rid of the air that was put into your GI tract during the procedure and reduce the bloating. If you had a lower endoscopy (such as a colonoscopy or flexible sigmoidoscopy) you may notice spotting of blood in your stool or on the toilet paper. If you underwent a bowel prep for your procedure, you may not have a normal bowel movement for a few days.  Please Note:  You might notice some irritation and congestion in your nose or some drainage.  This is from the oxygen used during your procedure.  There is no need for concern and it should clear up in a day or so.  SYMPTOMS TO REPORT IMMEDIATELY:   Following lower endoscopy (colonoscopy or flexible sigmoidoscopy):  Excessive amounts of blood in the stool  Significant tenderness or worsening of abdominal pains  Swelling of the abdomen that is new, acute  Fever of 100F or higher   For urgent or emergent issues, a gastroenterologist can be reached at any hour by calling [phone].   DIET: Your first meal following the procedure should be a small meal and then it is ok to progress to your normal diet. Heavy or fried foods are harder to digest and may make you feel nauseous or bloated.  Likewise, meals heavy in dairy and vegetables can increase bloating.  Drink plenty of fluids but you should avoid alcoholic beverages for 24  hours.  ACTIVITY:  You should plan to take it easy for the rest of today and you should NOT DRIVE or use heavy machinery until tomorrow (because of the sedation medicines used during the test).    FOLLOW UP: Our staff will call the number listed on your records the next business day following your procedure to check on you and address any questions or concerns that you may have regarding the information given to you following your procedure. If we do not reach you, we will leave a message.  However, if you are feeling well and you are not experiencing any problems, there is no need to return our call.  We will assume that you have returned to your regular daily activities without incident.  If any biopsies were taken you will be contacted by phone or by letter within the next 1-3 weeks.  Please call us at [phone] if you have not heard about the biopsies in 3 weeks.    SIGNATURES/CONFIDENTIALITY: You and/or your care partner have signed paperwork which will be entered into your electronic medical record.  These signatures attest to the fact that that the information above on your After Visit Summary has been reviewed and is understood.  Full responsibility of the confidentiality of this discharge information lies with you and/or your care-partner.  Normal screening Repeat Colonoscopy in 10 years  

## 2015-11-20 NOTE — Op Note (Signed)
[place]  [address] [place], [id number]   COLONOSCOPY PROCEDURE REPORT  PATIENT: Nicole Benitez, Nicole Benitez  MR#: [id number] BIRTHDATE: 1966-10-18 , 49  yrs. old GENDER: female ENDOSCOPIST: Milus Banister, MD REFERRED DY:[id number] Laurance Flatten, M.D. PROCEDURE DATE:  11/20/2015 PROCEDURE:   Colonoscopy, screening First Screening Colonoscopy - Avg.  risk and is 50 yrs.  old or older Yes.  Prior Negative Screening - Now for repeat screening. N/A  History of Adenoma - Now for follow-up colonoscopy & has been > or = to 3 yrs.  N/A  Recommend repeat exam, <10 yrs? No ASA CLASS:   Class II INDICATIONS:Screening for colonic neoplasia and Colorectal Neoplasm Risk Assessment for this procedure is average risk. MEDICATIONS: Monitored anesthesia care, Propofol 200 mg IV, and lidocaine 40mg  IV  DESCRIPTION OF PROCEDURE:   After the risks benefits and alternatives of the procedure were thoroughly explained, informed consent was obtained.  The digital rectal exam revealed no abnormalities of the rectum.   The LB [id number]  endoscope was introduced through the anus and advanced to the cecum, which was identified by both the appendix and ileocecal valve. No adverse events experienced.   The quality of the prep was excellent.  The instrument was then slowly withdrawn as the colon was fully examined. Estimated blood loss is zero unless otherwise noted in this procedure report.      COLON FINDINGS: A normal appearing cecum, ileocecal valve, and appendiceal orifice were identified.  The ascending, transverse, descending, sigmoid colon, and rectum appeared unremarkable. Retroflexed views revealed no abnormalities. The time to cecum = 3.0 Withdrawal time = 5.3   The scope was withdrawn and the procedure completed. COMPLICATIONS: There were no immediate complications.  ENDOSCOPIC IMPRESSION: Normal colonoscopy  RECOMMENDATIONS: You should continue to follow colorectal cancer  screening guidelines for "routine risk" patients with a repeat colonoscopy in 10 years.   eSigned:  Milus Banister, MD 11/20/2015 11:13 AM

## 2015-11-21 ENCOUNTER — Telehealth: Payer: Self-pay

## 2015-11-21 NOTE — Telephone Encounter (Signed)
  Follow up Call-  Call back number 11/20/2015  Post procedure Call Back phone  # [phone]  Permission to leave phone message Yes     Patient questions:  Do you have a fever, pain , or abdominal swelling? No. Pain Score  0 *  Have you tolerated food without any problems? Yes.    Have you been able to return to your normal activities? Yes.    Do you have any questions about your discharge instructions: Diet   No. Medications  No. Follow up visit  No.  Do you have questions or concerns about your Care? No.  Actions: * If pain score is 4 or above: No action needed, pain <4.

## 2015-12-07 ENCOUNTER — Telehealth: Payer: Self-pay | Admitting: Family

## 2015-12-07 MED ORDER — ONDANSETRON HCL 4 MG PO TABS: 4.0000 mg | ORAL_TABLET | Freq: Three times a day (TID) | ORAL | Status: DC | PRN

## 2015-12-07 NOTE — Telephone Encounter (Signed)
Aware of new script and provider's suggestions.

## 2015-12-07 NOTE — Telephone Encounter (Signed)
Zofran Prescription sent to pharmacy, force fluids, bland diet

## 2016-01-14 ENCOUNTER — Ambulatory Visit: Admitting: Family Medicine

## 2016-01-15 ENCOUNTER — Encounter: Payer: Self-pay | Admitting: Family

## 2016-01-15 ENCOUNTER — Ambulatory Visit (INDEPENDENT_AMBULATORY_CARE_PROVIDER_SITE_OTHER): Admitting: Family

## 2016-01-15 ENCOUNTER — Encounter (INDEPENDENT_AMBULATORY_CARE_PROVIDER_SITE_OTHER): Payer: Self-pay

## 2016-01-15 VITALS — BP 118/79 | HR 98 | Temp 97.7°F | Ht 64.0 in | Wt 136.8 lb

## 2016-01-15 DIAGNOSIS — H1011 Acute atopic conjunctivitis, right eye: Secondary | ICD-10-CM | POA: Diagnosis not present

## 2016-01-15 DIAGNOSIS — Z23 Encounter for immunization: Secondary | ICD-10-CM

## 2016-01-15 DIAGNOSIS — J309 Allergic rhinitis, unspecified: Secondary | ICD-10-CM | POA: Diagnosis not present

## 2016-01-15 MED ORDER — OLOPATADINE HCL 0.2 % OP SOLN: 1.0000 [drp] | Freq: Every day | OPHTHALMIC | Status: DC

## 2016-01-15 MED ORDER — PREDNISONE 10 MG (21) PO TBPK
10.0000 mg | ORAL_TABLET | Freq: Every day | ORAL | Status: DC
Start: 2016-01-15 — End: 2016-03-25

## 2016-01-15 MED ORDER — FLUTICASONE PROPIONATE 50 MCG/ACT NA SUSP: 2.0000 | Freq: Every day | NASAL | Status: DC

## 2016-01-15 MED ORDER — CETIRIZINE HCL 10 MG PO TABS: 10.0000 mg | ORAL_TABLET | Freq: Every day | ORAL | Status: DC

## 2016-01-15 NOTE — Patient Instructions (Addendum)
Allergic Conjunctivitis Allergic conjunctivitis is inflammation of the clear membrane that covers the white part of your eye and the inner surface of your eyelid (conjunctiva), and it is caused by allergies. The blood vessels in the conjunctiva become inflamed, and this causes the eye to become red or pink, and it often causes itchiness in the eye. Allergic conjunctivitis cannot be spread by one person to another person (noncontagious). CAUSES This condition is caused by an allergic reaction. Common causes of an allergic reaction (allergens) include:  Dust.  Pollen.  Mold.  Animal dander or secretions. RISK FACTORS This condition is more likely to develop if you are exposed to high levels of allergens that cause the allergic reaction. This might include being outdoors when air pollen levels are high or being around animals that you are allergic to. SYMPTOMS Symptoms of this condition may include:  Eye redness.  Tearing of the eyes.  Watery eyes.  Itchy eyes.  Burning feeling in the eyes.  Clear drainage from the eyes.  Swollen eyelids. DIAGNOSIS This condition may be diagnosed by medical history and physical exam. If you have drainage from your eyes, it may be tested to rule out other causes of conjunctivitis. TREATMENT Treatment for this condition often includes medicines. These may be eye drops, ointments, or oral medicines. They may be prescription medicines or over-the-counter medicines. HOME CARE INSTRUCTIONS  Take or apply medicines only as directed by your health care provider.  Do not touch or rub your eyes.  Do not wear contact lenses until the inflammation is gone. Wear glasses instead.  Do not wear eye makeup until the inflammation is gone.  Apply a cool, clean washcloth to your eye for 10-20 minutes, 3-4 times a day.  Try to avoid whatever allergen is causing the allergic reaction. SEEK MEDICAL CARE IF:  Your symptoms get worse.  You have pus draining  from your eye.  You have new symptoms.  You have a fever.   This information is not intended to replace advice given to you by your health care provider. Make sure you discuss any questions you have with your health care provider.   Document Released: 01/17/2003 Document Revised: 11/17/2014 Document Reviewed: 08/08/2014 Elsevier Interactive Patient Education 2016 [employer]  

## 2016-01-15 NOTE — Addendum Note (Signed)
Addended by: Shelbie Ammons on: 01/15/2016 09:59 AM   Modules accepted: Orders

## 2016-01-15 NOTE — Progress Notes (Signed)
Subjective:    Patient ID: Nicole Benitez, female    DOB: May 17, 1966, 50 y.o.   MRN: [id number]  HPI Pt presents to the office today with right eye swelling. Pt states this started Saturday and seems to be unchanged. Pt states she has used a warm compresses that seem to have helped. PT states her allergies seem to be have been triggered this and seems to make the swelling worse. Pt denies any visual, gait, or speech changes. Pt states she has been taking a daily Claritin. Pt reports sneezing and a "scrartchy throat".     Review of Systems  Constitutional: Negative.   HENT: Negative.   Eyes: Negative.   Respiratory: Negative.  Negative for shortness of breath.   Cardiovascular: Negative.  Negative for palpitations.  Gastrointestinal: Negative.   Endocrine: Negative.   Genitourinary: Negative.   Musculoskeletal: Negative.   Neurological: Negative.  Negative for headaches.  Hematological: Negative.   Psychiatric/Behavioral: Negative.   All other systems reviewed and are negative.      Objective:   Physical Exam  Constitutional: She is oriented to person, place, and time. She appears well-developed and well-nourished. No distress.  HENT:  Head: Normocephalic and atraumatic.  Mouth/Throat: Oropharynx is clear and moist.  Eyes: Pupils are equal, round, and reactive to light.  Neck: Normal range of motion. Neck supple. No thyromegaly present.  Cardiovascular: Normal rate, regular rhythm, normal heart sounds and intact distal pulses.   No murmur heard. Pulmonary/Chest: Effort normal and breath sounds normal. No respiratory distress. She has no wheezes.  Abdominal: Soft. Bowel sounds are normal. She exhibits no distension. There is no tenderness.  Musculoskeletal: Normal range of motion. She exhibits no edema or tenderness.  Neurological: She is alert and oriented to person, place, and time. She has normal reflexes. No cranial nerve deficit.  Skin: Skin is warm and dry.  Psychiatric:  She has a normal mood and affect. Her behavior is normal. Judgment and thought content normal.  Vitals reviewed.   BP 118/79 mmHg  Pulse 98  Temp(Src) 97.7 F (36.5 C) (Oral)  Ht 5\' 4"  (1.626 m)  Wt 136 lb 12.8 oz (62.052 kg)  BMI 23.47 kg/m2       Assessment & Plan:  1. Allergic rhinitis, unspecified allergic rhinitis type - fluticasone (FLONASE) 50 MCG/ACT nasal spray; Place 2 sprays into both nostrils daily.  Dispense: 16 g; Refill: 6 - cetirizine (ZYRTEC) 10 MG tablet; Take 1 tablet (10 mg total) by mouth daily.  Dispense: 30 tablet; Refill: 11 - predniSONE (STERAPRED UNI-PAK 21 TAB) 10 MG (21) TBPK tablet; Take 1 tablet (10 mg total) by mouth daily. As directed x 6 days  Dispense: 21 tablet; Refill: 0 - Olopatadine HCl (PATADAY) 0.2 % SOLN; Apply 1 drop to eye daily.  Dispense: 2.5 mL; Refill: 6  2. Allergic conjunctivitis, right - fluticasone (FLONASE) 50 MCG/ACT nasal spray; Place 2 sprays into both nostrils daily.  Dispense: 16 g; Refill: 6 - cetirizine (ZYRTEC) 10 MG tablet; Take 1 tablet (10 mg total) by mouth daily.  Dispense: 30 tablet; Refill: 11 - predniSONE (STERAPRED UNI-PAK 21 TAB) 10 MG (21) TBPK tablet; Take 1 tablet (10 mg total) by mouth daily. As directed x 6 days  Dispense: 21 tablet; Refill: 0 - Olopatadine HCl (PATADAY) 0.2 % SOLN; Apply 1 drop to eye daily.  Dispense: 2.5 mL; Refill: 6   *RTO in 2 weeks if symptoms have not improved Warm compresses Do not rub eye  Vincente Asbridge  Lenna Gilford, [place]

## 2016-01-17 ENCOUNTER — Ambulatory Visit: Admitting: Family Medicine

## 2016-02-07 ENCOUNTER — Telehealth: Payer: Self-pay | Admitting: Family

## 2016-02-07 NOTE — Telephone Encounter (Signed)
Patient aware that she is due 4/4.

## 2016-02-14 ENCOUNTER — Ambulatory Visit (INDEPENDENT_AMBULATORY_CARE_PROVIDER_SITE_OTHER): Admitting: *Deleted

## 2016-02-14 DIAGNOSIS — Z23 Encounter for immunization: Secondary | ICD-10-CM | POA: Diagnosis not present

## 2016-02-14 NOTE — Progress Notes (Signed)
Pt given HepA/HepB combination vaccine (Twinrix) IM right deltoid and tolerated well.

## 2016-03-25 ENCOUNTER — Encounter: Payer: Self-pay | Admitting: Family

## 2016-03-25 ENCOUNTER — Ambulatory Visit (INDEPENDENT_AMBULATORY_CARE_PROVIDER_SITE_OTHER): Admitting: Family

## 2016-03-25 VITALS — BP 110/81 | HR 116 | Temp 100.9°F | Ht 64.0 in | Wt 137.8 lb

## 2016-03-25 DIAGNOSIS — J0100 Acute maxillary sinusitis, unspecified: Secondary | ICD-10-CM | POA: Diagnosis not present

## 2016-03-25 DIAGNOSIS — J309 Allergic rhinitis, unspecified: Secondary | ICD-10-CM | POA: Diagnosis not present

## 2016-03-25 DIAGNOSIS — R6889 Other general symptoms and signs: Secondary | ICD-10-CM | POA: Diagnosis not present

## 2016-03-25 LAB — VERITOR FLU A/B WAIVED
INFLUENZA A: NEGATIVE
INFLUENZA B: NEGATIVE

## 2016-03-25 MED ORDER — FLUTICASONE PROPIONATE 50 MCG/ACT NA SUSP: 2.0000 | Freq: Every day | NASAL | Status: DC

## 2016-03-25 MED ORDER — AMOXICILLIN-POT CLAVULANATE 875-125 MG PO TABS: 1.0000 | ORAL_TABLET | Freq: Two times a day (BID) | ORAL | Status: DC

## 2016-03-25 NOTE — Progress Notes (Signed)
Subjective:    Patient ID: Nicole Benitez, female    DOB: July 08, 1966, 50 y.o.   MRN: [id number]  Sinus Problem This is a new problem. The problem has been gradually worsening since onset. The maximum temperature recorded prior to her arrival was 100.4 - 100.9 F. Her pain is at a severity of 8/10. The pain is moderate. Associated symptoms include congestion, coughing, headaches, a hoarse voice, shortness of breath, sinus pressure, sneezing and a sore throat. Pertinent negatives include no ear pain. Past treatments include oral decongestants and acetaminophen. The treatment provided mild relief.  Headache  Associated symptoms include coughing, a fever, sinus pressure and a sore throat. Pertinent negatives include no ear pain.  Fever  Associated symptoms include congestion, coughing, headaches and a sore throat. Pertinent negatives include no ear pain.      Review of Systems  Constitutional: Positive for fever.  HENT: Positive for congestion, hoarse voice, sinus pressure, sneezing and sore throat. Negative for ear pain.   Respiratory: Positive for cough and shortness of breath.   Neurological: Positive for headaches.  All other systems reviewed and are negative.      Objective:   Physical Exam  Constitutional: She is oriented to person, place, and time. She appears well-developed and well-nourished. She has a sickly appearance. No distress.  HENT:  Head: Normocephalic and atraumatic.  Right Ear: Tympanic membrane is erythematous and bulging.  Left Ear: Tympanic membrane is erythematous and bulging.  Nose: Mucosal edema and rhinorrhea present. Right sinus exhibits maxillary sinus tenderness and frontal sinus tenderness. Left sinus exhibits maxillary sinus tenderness and frontal sinus tenderness.  Mouth/Throat: Oropharyngeal exudate present.  Eyes: Pupils are equal, round, and reactive to light.  Neck: Normal range of motion. Neck supple. No thyromegaly present.  Cardiovascular: Normal  rate, regular rhythm, normal heart sounds and intact distal pulses.   No murmur heard. Pulmonary/Chest: Effort normal and breath sounds normal. No respiratory distress. She has no wheezes.  Abdominal: Soft. Bowel sounds are normal. She exhibits no distension. There is no tenderness.  Musculoskeletal: Normal range of motion. She exhibits no edema or tenderness.  Neurological: She is alert and oriented to person, place, and time.  Skin: Skin is warm and dry.  Psychiatric: She has a normal mood and affect. Her behavior is normal. Judgment and thought content normal.  Vitals reviewed.     BP 110/81 mmHg  Pulse 116  Temp(Src) 100.9 F (38.3 C) (Oral)  Ht 5\' 4"  (1.626 m)  Wt 137 lb 12.8 oz (62.506 kg)  BMI 23.64 kg/m2     Assessment & Plan:  1. Flu-like symptoms - Veritor Flu A/B Waived  2. Allergic rhinitis, unspecified allergic rhinitis type - amoxicillin-clavulanate (AUGMENTIN) 875-125 MG tablet; Take 1 tablet by mouth 2 (two) times daily.  Dispense: 14 tablet; Refill: 0  3. Acute maxillary sinusitis, recurrence not specified -- Take meds as prescribed - Use a cool mist humidifier  -Use saline nose sprays frequently -Saline irrigations of the nose can be very helpful if done frequently.  * 4X daily for 1 week*  * Use of a nettie pot can be helpful with this. Follow directions with this* -Force fluids -For any cough or congestion  Use plain Mucinex- regular strength or max strength is fine   * Children- consult with Pharmacist for dosing -For fever or aces or pains- take tylenol or ibuprofen appropriate for age and weight.  * for fevers greater than 101 orally you may alternate ibuprofen and tylenol  every  3 hours. -Throat lozenges if hep - fluticasone (FLONASE) 50 MCG/ACT nasal spray; Place 2 sprays into both nostrils daily.  Dispense: 16 g; Refill: [place], FNP

## 2016-03-25 NOTE — Patient Instructions (Signed)
Sinusitis, Adult Sinusitis is redness, soreness, and inflammation of the paranasal sinuses. Paranasal sinuses are air pockets within the bones of your face. They are located beneath your eyes, in the middle of your forehead, and above your eyes. In healthy paranasal sinuses, mucus is able to drain out, and air is able to circulate through them by way of your nose. However, when your paranasal sinuses are inflamed, mucus and air can become trapped. This can allow bacteria and other germs to grow and cause infection. Sinusitis can develop quickly and last only a short time (acute) or continue over a long period (chronic). Sinusitis that lasts for more than 12 weeks is considered chronic. CAUSES Causes of sinusitis include:  Allergies.  Structural abnormalities, such as displacement of the cartilage that separates your nostrils (deviated septum), which can decrease the air flow through your nose and sinuses and affect sinus drainage.  Functional abnormalities, such as when the small hairs (cilia) that line your sinuses and help remove mucus do not work properly or are not present. SIGNS AND SYMPTOMS Symptoms of acute and chronic sinusitis are the same. The primary symptoms are pain and pressure around the affected sinuses. Other symptoms include:  Upper toothache.  Earache.  Headache.  Bad breath.  Decreased sense of smell and taste.  A cough, which worsens when you are lying flat.  Fatigue.  Fever.  Thick drainage from your nose, which often is green and may contain pus (purulent).  Swelling and warmth over the affected sinuses. DIAGNOSIS Your health care provider will perform a physical exam. During your exam, your health care provider may perform any of the following to help determine if you have acute sinusitis or chronic sinusitis:  Look in your nose for signs of abnormal growths in your nostrils (nasal polyps).  Tap over the affected sinus to check for signs of  infection.  View the inside of your sinuses using an imaging device that has a light attached (endoscope). If your health care provider suspects that you have chronic sinusitis, one or more of the following tests may be recommended:  Allergy tests.  Nasal culture. A sample of mucus is taken from your nose, sent to a lab, and screened for bacteria.  Nasal cytology. A sample of mucus is taken from your nose and examined by your health care provider to determine if your sinusitis is related to an allergy. TREATMENT Most cases of acute sinusitis are related to a viral infection and will resolve on their own within 10 days. Sometimes, medicines are prescribed to help relieve symptoms of both acute and chronic sinusitis. These may include pain medicines, decongestants, nasal steroid sprays, or saline sprays. However, for sinusitis related to a bacterial infection, your health care provider will prescribe antibiotic medicines. These are medicines that will help kill the bacteria causing the infection. Rarely, sinusitis is caused by a fungal infection. In these cases, your health care provider will prescribe antifungal medicine. For some cases of chronic sinusitis, surgery is needed. Generally, these are cases in which sinusitis recurs more than 3 times per year, despite other treatments. HOME CARE INSTRUCTIONS  Drink plenty of water. Water helps thin the mucus so your sinuses can drain more easily.  Use a humidifier.  Inhale steam 3-4 times a day (for example, sit in the bathroom with the shower running).  Apply a warm, moist washcloth to your face 3-4 times a day, or as directed by your health care provider.  Use saline nasal sprays to help   moisten and clean your sinuses.  Take medicines only as directed by your health care provider.  If you were prescribed either an antibiotic or antifungal medicine, finish it all even if you start to feel better. SEEK IMMEDIATE MEDICAL CARE IF:  You have  increasing pain or severe headaches.  You have nausea, vomiting, or drowsiness.  You have swelling around your face.  You have vision problems.  You have a stiff neck.  You have difficulty breathing.   This information is not intended to replace advice given to you by your health care provider. Make sure you discuss any questions you have with your health care provider.   Document Released: 10/27/2005 Document Revised: 11/17/2014 Document Reviewed: 11/11/2011 Elsevier Interactive Patient Education 2016 [employer]  - Take meds as prescribed - Use a cool mist humidifier  -Use saline nose sprays frequently -Saline irrigations of the nose can be very helpful if done frequently.  * 4X daily for 1 week*  * Use of a nettie pot can be helpful with this. Follow directions with this* -Force fluids -For any cough or congestion  Use plain Mucinex- regular strength or max strength is fine   * Children- consult with Pharmacist for dosing -For fever or aces or pains- take tylenol or ibuprofen appropriate for age and weight.  * for fevers greater than 101 orally you may alternate ibuprofen and tylenol every  3 hours. -Throat lozenges if help   Christy Hawks, FNP   

## 2016-03-31 ENCOUNTER — Other Ambulatory Visit: Payer: Self-pay | Admitting: Family Medicine

## 2016-03-31 ENCOUNTER — Encounter: Payer: Self-pay | Admitting: Family

## 2016-03-31 ENCOUNTER — Other Ambulatory Visit: Payer: Self-pay

## 2016-03-31 DIAGNOSIS — N644 Mastodynia: Secondary | ICD-10-CM

## 2016-03-31 DIAGNOSIS — N6310 Unspecified lump in the right breast, unspecified quadrant: Secondary | ICD-10-CM

## 2016-03-31 DIAGNOSIS — Z1231 Encounter for screening mammogram for malignant neoplasm of breast: Secondary | ICD-10-CM

## 2016-04-04 ENCOUNTER — Ambulatory Visit
Admission: RE | Admit: 2016-04-04 | Discharge: 2016-04-04 | Disposition: A | Discharge status: Home / Self Care | Source: Ambulatory Visit | Attending: Family Medicine | Admitting: Family Medicine

## 2016-04-04 DIAGNOSIS — N6310 Unspecified lump in the right breast, unspecified quadrant: Secondary | ICD-10-CM

## 2016-04-04 DIAGNOSIS — N644 Mastodynia: Secondary | ICD-10-CM

## 2016-04-22 ENCOUNTER — Ambulatory Visit (INDEPENDENT_AMBULATORY_CARE_PROVIDER_SITE_OTHER): Admitting: Family Medicine

## 2016-04-22 ENCOUNTER — Encounter: Payer: Self-pay | Admitting: Family Medicine

## 2016-04-22 VITALS — BP 114/75 | HR 75 | Temp 97.5°F | Ht 64.0 in | Wt 141.0 lb

## 2016-04-22 DIAGNOSIS — R300 Dysuria: Secondary | ICD-10-CM

## 2016-04-22 DIAGNOSIS — N3090 Cystitis, unspecified without hematuria: Secondary | ICD-10-CM

## 2016-04-22 LAB — URINALYSIS, COMPLETE
Bilirubin, UA: NEGATIVE
Glucose, UA: NEGATIVE
KETONES UA: NEGATIVE
Nitrite, UA: NEGATIVE
Specific Gravity, UA: 1.015 (ref 1.005–1.030)
Urobilinogen, Ur: 0.2 mg/dL (ref 0.2–1.0)
pH, UA: 6.5 (ref 5.0–7.5)

## 2016-04-22 LAB — MICROSCOPIC EXAMINATION: WBC, UA: >30 /hpf — AB (ref 0–?)

## 2016-04-22 MED ORDER — NITROFURANTOIN MONOHYD MACRO 100 MG PO CAPS: 100.0000 mg | ORAL_CAPSULE | Freq: Two times a day (BID) | ORAL | Status: DC

## 2016-04-22 MED ORDER — PHENAZOPYRIDINE HCL 200 MG PO TABS: 200.0000 mg | ORAL_TABLET | Freq: Four times a day (QID) | ORAL | Status: DC | PRN

## 2016-04-22 NOTE — Progress Notes (Signed)
Subjective:  Patient ID: [name], female    DOB: 1966-10-09  Age: 50 y.o. MRN: [id number]  CC: Urinary Tract Infection   HPI Mary-Ann T Nachtman presents for burning with urination and frequency for several days. Denies fever . No flank pain. No nausea, vomiting.    History Kelcy has a past medical history of Allergy; Hyperlipidemia; GERD (gastroesophageal reflux disease); and Cancer ([place]).   She has past surgical history that includes Skin cancer excision (Right, 11-10-2008).   Her family history includes Cancer in her father; Hyperlipidemia in her mother; Hypertension in her brother and mother. There is no history of Colon cancer, Colon polyps, Esophageal cancer, Rectal cancer, or Stomach cancer.She reports that she has never smoked. She has never used smokeless tobacco. She reports that she does not drink alcohol or use illicit drugs.    ROS Review of Systems  Constitutional: Negative for fever, chills and diaphoresis.  HENT: Negative for congestion.   Eyes: Negative for visual disturbance.  Respiratory: Negative for cough and shortness of breath.   Cardiovascular: Negative for chest pain and palpitations.  Gastrointestinal: Negative for nausea, diarrhea and constipation.  Genitourinary: Positive for dysuria, urgency and frequency. Negative for hematuria, flank pain, decreased urine volume, menstrual problem and pelvic pain.  Musculoskeletal: Negative for joint swelling and arthralgias.  Skin: Negative for rash.  Neurological: Negative for dizziness and numbness.    Objective:  BP 114/75 mmHg  Pulse 75  Temp(Src) 97.5 F (36.4 C) (Oral)  Ht 5\' 4"  (1.626 m)  Wt 141 lb (63.957 kg)  BMI 24.19 kg/m2  SpO2 100%  LMP 09/04/2015  BP Readings from Last 3 Encounters:  04/22/16 114/75  03/25/16 110/81  01/15/16 118/79    Wt Readings from Last 3 Encounters:  04/22/16 141 lb (63.957 kg)  03/25/16 137 lb 12.8 oz (62.506 kg)  01/15/16 136 lb 12.8 oz (62.052 kg)      Physical Exam  Constitutional: She is oriented to person, place, and time. She appears well-developed and well-nourished.  HENT:  Head: Normocephalic and atraumatic.  Cardiovascular: Normal rate and regular rhythm.   No murmur heard. Pulmonary/Chest: Effort normal and breath sounds normal.  Abdominal: Soft. Bowel sounds are normal. She exhibits no mass. There is no tenderness. There is no rebound and no guarding.  Musculoskeletal: She exhibits no tenderness.  Neurological: She is alert and oriented to person, place, and time.  Skin: Skin is warm and dry.  Psychiatric: She has a normal mood and affect. Her behavior is normal.     Lab Results  Component Value Date   WBC 7.8 09/11/2015   HCT 41.1 09/11/2015   PLT 294 09/11/2015   GLUCOSE 103* 09/11/2015   CHOL 220* 09/11/2015   TRIG 83 09/11/2015   HDL 80 09/11/2015   LDLCALC 123* 09/11/2015   ALT 17 09/11/2015   AST 21 09/11/2015   NA 142 09/11/2015   K 4.0 09/11/2015   CL 99 09/11/2015   CREATININE 0.76 09/11/2015   BUN 14 09/11/2015   CO2 22 09/11/2015   TSH 1.650 09/11/2015       Assessment & Plan:   Calina was seen today for urinary tract infection.  Diagnoses and all orders for this visit:  Dysuria -     Urinalysis, Complete -     Urine culture  Cystitis -     Urine culture -     phenazopyridine (PYRIDIUM) 200 MG tablet; Take 1 tablet (200 mg total) by mouth 4 (four)  times daily as needed for pain (urinary frequency &/or pain). -     nitrofurantoin, macrocrystal-monohydrate, (MACROBID) 100 MG capsule; Take 1 capsule (100 mg total) by mouth 2 (two) times daily.      I have discontinued Ms. Mendonsa's amoxicillin-clavulanate. I am also having her start on phenazopyridine and nitrofurantoin (macrocrystal-monohydrate). Additionally, I am having her maintain her Vitamin D (Ergocalciferol), simvastatin, vitamin C, vitamin E, cetirizine, Olopatadine HCl, and fluticasone.  Meds ordered this encounter   Medications  . phenazopyridine (PYRIDIUM) 200 MG tablet    Sig: Take 1 tablet (200 mg total) by mouth 4 (four) times daily as needed for pain (urinary frequency &/or pain).    Dispense:  12 tablet    Refill:  0  . nitrofurantoin, macrocrystal-monohydrate, (MACROBID) 100 MG capsule    Sig: Take 1 capsule (100 mg total) by mouth 2 (two) times daily.    Dispense:  14 capsule    Refill:  0     Follow-up: No Follow-up on file.  Claretta Fraise, M.D.

## 2016-04-24 LAB — URINE CULTURE

## 2016-07-17 ENCOUNTER — Ambulatory Visit

## 2016-07-18 ENCOUNTER — Ambulatory Visit (INDEPENDENT_AMBULATORY_CARE_PROVIDER_SITE_OTHER): Admitting: *Deleted

## 2016-07-18 DIAGNOSIS — Z23 Encounter for immunization: Secondary | ICD-10-CM

## 2016-07-18 NOTE — Progress Notes (Signed)
Pt given HepA/B inj Tolerated well

## 2016-07-28 ENCOUNTER — Other Ambulatory Visit: Payer: Self-pay | Admitting: Family Medicine

## 2016-07-28 DIAGNOSIS — Z1231 Encounter for screening mammogram for malignant neoplasm of breast: Secondary | ICD-10-CM

## 2016-07-30 ENCOUNTER — Ambulatory Visit
Admission: RE | Admit: 2016-07-30 | Discharge: 2016-07-30 | Disposition: A | Discharge status: Home / Self Care | Source: Ambulatory Visit | Attending: Family Medicine | Admitting: Family Medicine

## 2016-07-30 DIAGNOSIS — Z1231 Encounter for screening mammogram for malignant neoplasm of breast: Secondary | ICD-10-CM

## 2016-08-15 ENCOUNTER — Other Ambulatory Visit: Payer: Self-pay | Admitting: Family

## 2016-08-15 DIAGNOSIS — E559 Vitamin D deficiency, unspecified: Secondary | ICD-10-CM

## 2016-09-26 ENCOUNTER — Other Ambulatory Visit: Payer: Self-pay | Admitting: Family

## 2016-09-26 DIAGNOSIS — E785 Hyperlipidemia, unspecified: Secondary | ICD-10-CM

## 2016-09-29 NOTE — Telephone Encounter (Signed)
Authorize 30 days only. Then contact the patient letting them know that they will need an appointment before any further prescriptions can be sent in. 

## 2016-09-29 NOTE — Telephone Encounter (Signed)
One month- needs appt. With lipid level

## 2016-11-11 ENCOUNTER — Other Ambulatory Visit: Payer: Self-pay | Admitting: Family Medicine

## 2016-11-11 DIAGNOSIS — E785 Hyperlipidemia, unspecified: Secondary | ICD-10-CM

## 2016-11-11 NOTE — Telephone Encounter (Signed)
Authorize 30 days only. Then contact the patient letting them know that they will need an appointment before any further prescriptions can be sent in. 

## 2016-11-27 ENCOUNTER — Other Ambulatory Visit: Payer: Self-pay | Admitting: *Deleted

## 2016-12-18 ENCOUNTER — Other Ambulatory Visit: Payer: Self-pay | Admitting: Family Medicine

## 2016-12-18 DIAGNOSIS — E785 Hyperlipidemia, unspecified: Secondary | ICD-10-CM

## 2017-01-26 ENCOUNTER — Other Ambulatory Visit: Payer: Self-pay | Admitting: Family

## 2017-01-26 DIAGNOSIS — H1011 Acute atopic conjunctivitis, right eye: Secondary | ICD-10-CM

## 2017-01-26 DIAGNOSIS — J309 Allergic rhinitis, unspecified: Secondary | ICD-10-CM

## 2017-02-12 ENCOUNTER — Ambulatory Visit (INDEPENDENT_AMBULATORY_CARE_PROVIDER_SITE_OTHER): Admitting: Family

## 2017-02-12 ENCOUNTER — Encounter: Payer: Self-pay | Admitting: Family

## 2017-02-12 VITALS — BP 119/80 | HR 70 | Temp 97.9°F | Ht 64.0 in | Wt 137.6 lb

## 2017-02-12 DIAGNOSIS — E559 Vitamin D deficiency, unspecified: Secondary | ICD-10-CM

## 2017-02-12 DIAGNOSIS — E785 Hyperlipidemia, unspecified: Secondary | ICD-10-CM | POA: Diagnosis not present

## 2017-02-12 DIAGNOSIS — M7582 Other shoulder lesions, left shoulder: Secondary | ICD-10-CM

## 2017-02-12 MED ORDER — PREDNISONE 10 MG (21) PO TBPK: ORAL_TABLET | ORAL | 0 refills | Status: DC

## 2017-02-12 MED ORDER — SIMVASTATIN 20 MG PO TABS: 20.0000 mg | ORAL_TABLET | Freq: Every day | ORAL | 3 refills | Status: DC

## 2017-02-12 MED ORDER — NAPROXEN 500 MG PO TABS: 500.0000 mg | ORAL_TABLET | Freq: Two times a day (BID) | ORAL | 1 refills | Status: DC

## 2017-02-12 NOTE — Progress Notes (Signed)
   Subjective:    Patient ID: Nicole Benitez, female    DOB: 05-Aug-1966, 51 y.o.   MRN: [id number]  Pt presents to the office today for chronic follow up and shoulder pain.  Shoulder Pain   The pain is present in the left shoulder. This is a new problem. The current episode started more than 1 month ago. There has been no history of extremity trauma. The problem occurs constantly. The problem has been gradually worsening. The pain is at a severity of 5/10. The pain is moderate. Associated symptoms include a limited range of motion. Pertinent negatives include no inability to bear weight, numbness or stiffness. She has tried nothing for the symptoms. The treatment provided no relief.  Hyperlipidemia  This is a chronic problem. The current episode started more than 1 year ago. The problem is uncontrolled. Recent lipid tests were reviewed and are high. Current antihyperlipidemic treatment includes diet change. The current treatment provides moderate improvement of lipids. Risk factors for coronary artery disease include dyslipidemia and post-menopausal.      Review of Systems  Musculoskeletal: Negative for stiffness.  Neurological: Negative for numbness.  All other systems reviewed and are negative.      Objective:   Physical Exam  Constitutional: She is oriented to person, place, and time. She appears well-developed and well-nourished. No distress.  HENT:  Head: Normocephalic.  Right Ear: External ear normal.  Left Ear: External ear normal.  Nose: Nose normal.  Mouth/Throat: Oropharynx is clear and moist.  Eyes: Pupils are equal, round, and reactive to light.  Neck: Normal range of motion. Neck supple. No thyromegaly present.  Cardiovascular: Normal rate, regular rhythm, normal heart sounds and intact distal pulses.   No murmur heard. Pulmonary/Chest: Effort normal and breath sounds normal. No respiratory distress. She has no wheezes.  Abdominal: Soft. Bowel sounds are normal. She  exhibits no distension. There is no tenderness.  Musculoskeletal: She exhibits tenderness (left bicep tenderness with abduction). She exhibits no edema.  Neurological: She is alert and oriented to person, place, and time.  Skin: Skin is warm and dry.  Psychiatric: She has a normal mood and affect. Her behavior is normal. Judgment and thought content normal.  Vitals reviewed.     BP 119/80   Pulse 70   Temp 97.9 F (36.6 C) (Oral)   Ht _0  (1.626 m)   Wt 137 lb 9.6 oz (62.4 kg)   LMP 09/04/2015 Comment: irregular   BMI 23.62 kg/m      Assessment & Plan:  1. Hyperlipidemia, unspecified hyperlipidemia type - CMP14+EGFR - Lipid panel - simvastatin (ZOCOR) 20 MG tablet; Take 1 tablet (20 mg total) by mouth at bedtime.  Dispense: 90 tablet; Refill: 3  2. Vitamin D deficiency - CMP14+EGFR - VITAMIN D 25 Hydroxy (Vit-D Deficiency, Fractures)  3. Rotator cuff tendinitis, left -Rest -Ice and heat -ROM exercises discussed RTO prn  - CMP14+EGFR - predniSONE (STERAPRED UNI-PAK 21 TAB) 10 MG (21) TBPK tablet; Use as directed  Dispense: 21 tablet; Refill: 0 - naproxen (NAPROSYN) 500 MG tablet; Take 1 tablet (500 mg total) by mouth 2 (two) times daily with a meal.  Dispense: 60 tablet; Refill: 1   Continue all meds Labs pending Health Maintenance reviewed Diet and exercise encouraged RTO 1 months   Evelina Dun, FNP

## 2017-02-12 NOTE — Patient Instructions (Signed)
Rotator Cuff Tendinitis Rotator cuff tendinitis is inflammation of the tough, cord-like bands that connect muscle to bone (tendons) in the rotator cuff. The rotator cuff includes all of the muscles and tendons that connect the arm to the shoulder. The rotator cuff holds the head of the upper arm bone (humerus) in the cup (fossa) of the shoulder blade (scapula). This condition can lead to a long-lasting (chronic) tear. The tear may be partial or complete. What are the causes? This condition is usually caused by overusing the rotator cuff. What increases the risk? This condition is more likely to develop in athletes and workers who frequently use their shoulder or reach over their heads. This can include activities such as:  Tennis.  Baseball or softball.  Swimming.  Construction work.  Painting.  What are the signs or symptoms? Symptoms of this condition include:  Pain spreading (radiating) from the shoulder to the upper arm.  Swelling and tenderness in front of the shoulder.  Pain when reaching, pulling, or lifting the arm above the head.  Pain when lowering the arm from above the head.  Minor pain in the shoulder when resting.  Increased pain in the shoulder at night.  Difficulty placing the arm behind the back.  How is this diagnosed? This condition is diagnosed with a medical history and physical exam. Tests may also be done, including:  X-rays.  MRI.  Ultrasounds.  CT or MR arthrogram. During this test, a contrast material is injected and then images are taken.  How is this treated? Treatment for this condition depends on the severity of the condition. In less severe cases, treatment may include:  Rest. This may be done with a sling that holds the shoulder still (immobilization). Your health care provider may also recommend avoiding activities that involve lifting your arm over your head.  Icing the shoulder.  Anti-inflammatory medicines, such as aspirin or  ibuprofen.  In more severe cases, treatment may include:  Physical therapy.  Steroid injections.  Surgery.  Follow these instructions at home: If you have a sling:  Wear the sling as told by your health care provider. Remove it only as told by your health care provider.  Loosen the sling if your fingers tingle, become numb, or turn cold and blue.  Keep the sling clean.  If the sling is not waterproof, do not let it get wet. Remove it, if allowed, or cover it with a watertight covering when you take a bath or shower. Managing pain, stiffness, and swelling  If directed, put ice on the injured area. ? If you have a removable sling, remove it as told by your health care provider. ? Put ice in a plastic bag. ? Place a towel between your skin and the bag. ? Leave the ice on for 20 minutes, 2-3 times a day.  Move your fingers often to avoid stiffness and to lessen swelling.  Raise (elevate) the injured area above the level of your heart while you are lying down.  Find a comfortable sleeping position or sleep on a recliner, if available. Driving  Do not drive or use heavy machinery while taking prescription pain medicine.  Ask your health care provider when it is safe to drive if you have a sling on your arm. Activity  Rest your shoulder as told by your health care provider.  Return to your normal activities as told by your health care provider. Ask your health care provider what activities are safe for you.  Do any   exercises or stretches as told by your health care provider.  If you do repetitive overhead tasks, take small breaks in between and include stretching exercises as told by your health care provider. General instructions  Do not use any products that contain nicotine or tobacco, such as cigarettes and e-cigarettes. These can delay healing. If you need help quitting, ask your health care provider.  Take over-the-counter and prescription medicines only as told by  your health care provider.  Keep all follow-up visits as told by your health care provider. This is important. Contact a health care provider if:  Your pain gets worse.  You have new pain in your arm, hands, or fingers.  Your pain is not relieved with medicine or does not get better after 6 weeks of treatment.  You have cracking sensations when moving your shoulder in certain directions.  You hear a snapping sound after using your shoulder, followed by severe pain and weakness. Get help right away if:  Your arm, hand, or fingers are numb or tingling.  Your arm, hand, or fingers are swollen or painful or they turn white or blue. Summary  Rotator cuff tendinitis is inflammation of the tough, cord-like bands that connect muscle to bone (tendons) in the rotator cuff.  This condition is usually caused by overusing the rotator cuff, which includes all of the muscles and tendons that connect the arm to the shoulder.  This condition is more likely to develop in athletes and workers who frequently use their shoulder or reach over their heads.  Treatment generally includes rest, anti-inflammatory medicines, and icing. In some cases, physical therapy and steroid injections may be needed. In severe cases, surgery may be needed. This information is not intended to replace advice given to you by your health care provider. Make sure you discuss any questions you have with your health care provider. Document Released: 01/17/2004 Document Revised: 10/13/2016 Document Reviewed: 10/13/2016 Elsevier Interactive Patient Education  2017 [employer]  

## 2017-02-13 ENCOUNTER — Other Ambulatory Visit: Payer: Self-pay | Admitting: Family

## 2017-02-13 LAB — CMP14+EGFR
A/G RATIO: 2 (ref 1.2–2.2)
ALK PHOS: 85 IU/L (ref 39–117)
ALT: 11 IU/L (ref 0–32)
AST: 16 IU/L (ref 0–40)
Albumin: 4.7 g/dL (ref 3.5–5.5)
BILIRUBIN TOTAL: 0.4 mg/dL (ref 0.0–1.2)
BUN/Creatinine Ratio: 24 — ABNORMAL HIGH (ref 9–23)
BUN: 17 mg/dL (ref 6–24)
CHLORIDE: 103 mmol/L (ref 96–106)
CO2: 26 mmol/L (ref 18–29)
Calcium: 9.5 mg/dL (ref 8.7–10.2)
Creatinine, Ser: 0.72 mg/dL (ref 0.57–1.00)
GFR calc Af Amer: 111 mL/min/{1.73_m2} (ref >59)
GFR calc non Af Amer: 97 mL/min/{1.73_m2} (ref >59)
Globulin, Total: 2.4 g/dL (ref 1.5–4.5)
Glucose: 87 mg/dL (ref 65–99)
POTASSIUM: 4.2 mmol/L (ref 3.5–5.2)
Sodium: 143 mmol/L (ref 134–144)
Total Protein: 7.1 g/dL (ref 6.0–8.5)

## 2017-02-13 LAB — LIPID PANEL
CHOL/HDL RATIO: 2.9 ratio (ref 0.0–4.4)
Cholesterol, Total: 191 mg/dL (ref 100–199)
HDL: 66 mg/dL (ref >39)
LDL Calculated: 113 mg/dL — ABNORMAL HIGH (ref 0–99)
TRIGLYCERIDES: 60 mg/dL (ref 0–149)
VLDL CHOLESTEROL CAL: 12 mg/dL (ref 5–40)

## 2017-02-13 LAB — VITAMIN D 25 HYDROXY (VIT D DEFICIENCY, FRACTURES): Vit D, 25-Hydroxy: 21.9 ng/mL — ABNORMAL LOW (ref 30.0–100.0)

## 2017-02-13 MED ORDER — VITAMIN D (ERGOCALCIFEROL) 1.25 MG (50000 UNIT) PO CAPS: 50000.0000 [IU] | ORAL_CAPSULE | ORAL | 3 refills | Status: DC

## 2017-04-02 ENCOUNTER — Other Ambulatory Visit: Payer: Self-pay | Admitting: Family

## 2017-04-02 DIAGNOSIS — J309 Allergic rhinitis, unspecified: Secondary | ICD-10-CM

## 2017-04-02 DIAGNOSIS — H1011 Acute atopic conjunctivitis, right eye: Secondary | ICD-10-CM

## 2017-05-08 ENCOUNTER — Other Ambulatory Visit: Payer: Self-pay | Admitting: Family

## 2017-05-08 ENCOUNTER — Encounter: Payer: Self-pay | Admitting: Family

## 2017-05-20 ENCOUNTER — Encounter: Payer: Self-pay | Admitting: Family

## 2017-05-21 ENCOUNTER — Other Ambulatory Visit: Payer: Self-pay | Admitting: Family

## 2017-05-21 MED ORDER — SCOPOLAMINE 1 MG/3DAYS TD PT72: 1.0000 | MEDICATED_PATCH | TRANSDERMAL | 1 refills | Status: AC

## 2017-07-29 ENCOUNTER — Other Ambulatory Visit: Payer: Self-pay | Admitting: Family

## 2017-07-29 DIAGNOSIS — J309 Allergic rhinitis, unspecified: Secondary | ICD-10-CM

## 2017-07-29 DIAGNOSIS — H1011 Acute atopic conjunctivitis, right eye: Secondary | ICD-10-CM

## 2017-09-17 ENCOUNTER — Other Ambulatory Visit: Payer: Self-pay | Admitting: Family Medicine

## 2017-09-17 DIAGNOSIS — Z139 Encounter for screening, unspecified: Secondary | ICD-10-CM

## 2017-09-28 ENCOUNTER — Ambulatory Visit (INDEPENDENT_AMBULATORY_CARE_PROVIDER_SITE_OTHER): Admitting: Nurse Practitioner

## 2017-09-28 ENCOUNTER — Encounter: Payer: Self-pay | Admitting: Nurse Practitioner

## 2017-09-28 VITALS — BP 113/71 | HR 76 | Temp 97.8°F | Ht 64.0 in | Wt 139.0 lb

## 2017-09-28 DIAGNOSIS — H00025 Hordeolum internum left lower eyelid: Secondary | ICD-10-CM | POA: Diagnosis not present

## 2017-09-28 MED ORDER — ERYTHROMYCIN 5 MG/GM OP OINT: 1.0000 "application " | TOPICAL_OINTMENT | Freq: Every day | OPHTHALMIC | 0 refills | Status: DC

## 2017-09-28 NOTE — Patient Instructions (Signed)

## 2017-09-28 NOTE — Progress Notes (Signed)
   Subjective:    Patient ID: Nicole Benitez, female    DOB: 11-Sep-1966, 51 y.o.   MRN: [id number]  HPI Patient comes  In today c/o left eye watering all the tome. This has been going on for several years. But the last few days it has gotten sore and lower lid is slightly swelling.    Review of Systems  Constitutional: Negative.   HENT: Negative.   Eyes: Positive for discharge (watery). Negative for pain, redness and visual disturbance.  Respiratory: Negative.   Cardiovascular: Negative.   Genitourinary: Negative.   Neurological: Negative.   Psychiatric/Behavioral: Negative.   All other systems reviewed and are negative.      Objective:   Physical Exam  Constitutional: She appears well-developed and well-nourished. No distress.  Eyes: Conjunctivae are normal. Pupils are equal, round, and reactive to light. Right eye exhibits no discharge. Left eye exhibits discharge (watery).  White 1 cm lateral lower lid lesion on left  Cardiovascular: Normal rate and regular rhythm.  Pulmonary/Chest: Effort normal and breath sounds normal.  Neurological: She is alert.  Skin: Skin is warm.  Psychiatric: She has a normal mood and affect. Her behavior is normal. Thought content normal.   BP 113/71   Pulse 76   Temp 97.8 F (36.6 C) (Oral)   Ht 5\' 4"  (1.626 m)   Wt 139 lb (63 kg)   LMP 09/04/2015 Comment: irregular   BMI 23.86 kg/m       Assessment & Plan:   1. Hordeolum internum of left lower eyelid    Meds ordered this encounter  Medications  . erythromycin [facility]) ophthalmic ointment    Sig: Place 1 application at bedtime into the left eye.    Dispense:  3.5 g    Refill:  0    Order Specific Question:   Supervising Provider    Answer:   Eustaquio Maize [[id number]]   Cool compresses Force fluids RTO prn  Mary-Margaret Hassell Done, FNP

## 2017-10-02 ENCOUNTER — Encounter: Payer: Self-pay | Admitting: Nurse Practitioner

## 2017-10-15 ENCOUNTER — Ambulatory Visit
Admission: RE | Admit: 2017-10-15 | Discharge: 2017-10-15 | Disposition: A | Discharge status: Home / Self Care | Source: Ambulatory Visit | Attending: Family Medicine | Admitting: Family Medicine

## 2017-10-15 DIAGNOSIS — Z139 Encounter for screening, unspecified: Secondary | ICD-10-CM

## 2018-02-02 ENCOUNTER — Other Ambulatory Visit: Payer: Self-pay | Admitting: Family

## 2018-02-23 ENCOUNTER — Other Ambulatory Visit: Payer: Self-pay | Admitting: Family

## 2018-02-23 DIAGNOSIS — E785 Hyperlipidemia, unspecified: Secondary | ICD-10-CM

## 2018-02-24 ENCOUNTER — Other Ambulatory Visit: Payer: Self-pay | Admitting: Family

## 2018-02-24 DIAGNOSIS — H1011 Acute atopic conjunctivitis, right eye: Secondary | ICD-10-CM

## 2018-02-24 DIAGNOSIS — J309 Allergic rhinitis, unspecified: Secondary | ICD-10-CM

## 2018-02-25 ENCOUNTER — Other Ambulatory Visit: Payer: Self-pay | Admitting: Family

## 2018-02-25 DIAGNOSIS — E785 Hyperlipidemia, unspecified: Secondary | ICD-10-CM

## 2018-03-31 ENCOUNTER — Other Ambulatory Visit: Payer: Self-pay | Admitting: Family

## 2018-03-31 DIAGNOSIS — J309 Allergic rhinitis, unspecified: Secondary | ICD-10-CM

## 2018-03-31 DIAGNOSIS — H1011 Acute atopic conjunctivitis, right eye: Secondary | ICD-10-CM

## 2018-03-31 DIAGNOSIS — E785 Hyperlipidemia, unspecified: Secondary | ICD-10-CM

## 2018-04-02 ENCOUNTER — Other Ambulatory Visit: Payer: Self-pay | Admitting: Family

## 2018-04-02 DIAGNOSIS — E785 Hyperlipidemia, unspecified: Secondary | ICD-10-CM

## 2018-06-24 ENCOUNTER — Encounter: Payer: Self-pay | Admitting: Nurse Practitioner

## 2018-06-28 ENCOUNTER — Other Ambulatory Visit: Payer: Self-pay | Admitting: Family

## 2018-06-28 DIAGNOSIS — H1011 Acute atopic conjunctivitis, right eye: Secondary | ICD-10-CM

## 2018-06-28 DIAGNOSIS — J309 Allergic rhinitis, unspecified: Secondary | ICD-10-CM

## 2018-09-05 ENCOUNTER — Other Ambulatory Visit: Payer: Self-pay | Admitting: Family

## 2018-09-05 DIAGNOSIS — E785 Hyperlipidemia, unspecified: Secondary | ICD-10-CM

## 2018-09-06 NOTE — Telephone Encounter (Signed)
Last seen 09/28/17 and last lipid 02/12/17  Christy  Needs to be seen

## 2018-09-07 NOTE — Telephone Encounter (Signed)
NA mailbox full

## 2018-09-10 ENCOUNTER — Ambulatory Visit (INDEPENDENT_AMBULATORY_CARE_PROVIDER_SITE_OTHER): Admitting: Family

## 2018-09-10 ENCOUNTER — Encounter: Payer: Self-pay | Admitting: Family

## 2018-09-10 VITALS — BP 110/72 | HR 71 | Temp 97.0°F | Ht 64.0 in | Wt 141.6 lb

## 2018-09-10 DIAGNOSIS — Z23 Encounter for immunization: Secondary | ICD-10-CM

## 2018-09-10 DIAGNOSIS — E785 Hyperlipidemia, unspecified: Secondary | ICD-10-CM

## 2018-09-10 DIAGNOSIS — Z01419 Encounter for gynecological examination (general) (routine) without abnormal findings: Secondary | ICD-10-CM | POA: Diagnosis not present

## 2018-09-10 DIAGNOSIS — Z0000 Encounter for general adult medical examination without abnormal findings: Secondary | ICD-10-CM

## 2018-09-10 DIAGNOSIS — E559 Vitamin D deficiency, unspecified: Secondary | ICD-10-CM

## 2018-09-10 NOTE — Progress Notes (Addendum)
   Subjective:    Patient ID: Nicole Benitez, female    DOB: 1966-04-23, 52 y.o.   MRN: [id number]  Chief Complaint  Patient presents with  . Medical Management of Chronic Issues   Pt presents to the office today for CPE with pap.  Hyperlipidemia  This is a chronic problem. The current episode started more than 1 year ago. The problem is uncontrolled. Recent lipid tests were reviewed and are high. Current antihyperlipidemic treatment includes statins. The current treatment provides mild improvement of lipids. Risk factors for coronary artery disease include dyslipidemia and a sedentary lifestyle.  Gynecologic Exam  The patient's pertinent negatives include no genital itching, genital lesions, genital odor, vaginal bleeding or vaginal discharge. Pertinent negatives include no chills, flank pain or painful intercourse.      Review of Systems  Constitutional: Negative for chills.  Genitourinary: Negative for flank pain and vaginal discharge.  All other systems reviewed and are negative.      Objective:   Physical Exam  Constitutional: She is oriented to person, place, and time. She appears well-developed and well-nourished. No distress.  HENT:  Head: Normocephalic and atraumatic.  Right Ear: External ear normal.  Left Ear: External ear normal.  Mouth/Throat: Oropharynx is clear and moist.  Eyes: Pupils are equal, round, and reactive to light.  Neck: Normal range of motion. Neck supple. No thyromegaly present.  Cardiovascular: Normal rate, regular rhythm, normal heart sounds and intact distal pulses.  No murmur heard. Pulmonary/Chest: Effort normal and breath sounds normal. No respiratory distress. She has no wheezes. Right breast exhibits no inverted nipple, no mass, no nipple discharge, no skin change and no tenderness. Left breast exhibits no inverted nipple, no mass, no nipple discharge, no skin change and no tenderness.  Abdominal: Soft. Bowel sounds are normal. She exhibits no  distension. There is no tenderness.  Genitourinary: Vagina normal.  Genitourinary Comments: Bimanual exam- no adnexal masses or tenderness, ovaries nonpalpable   Cervix parous and pink- No discharge   Musculoskeletal: Normal range of motion. She exhibits no edema or tenderness.  Neurological: She is alert and oriented to person, place, and time. She has normal reflexes. No cranial nerve deficit.  Skin: Skin is warm and dry.  Psychiatric: She has a normal mood and affect. Her behavior is normal. Judgment and thought content normal.  Vitals reviewed.     BP 110/72   Pulse 71   Temp (!) 97 F (36.1 C) (Oral)   Ht '5\' 4"'$  (1.626 m)   Wt 141 lb 9.6 oz (64.2 kg)   LMP 09/04/2015 Comment: irregular   BMI 24.31 kg/m      Assessment & Plan:  Nicole Benitez comes in today with chief complaint of Medical Management of Chronic Issues   Diagnosis and orders addressed:  1. Annual physical exam - CMP14+EGFR - CBC with Differential/Platelet - Lipid panel - TSH - VITAMIN D 25 Hydroxy (Vit-D Deficiency, Fractures) - IGP, Aptima HPV, rfx 16/18,45  2. Encounter for gynecological examination without abnormal finding - CMP14+EGFR - CBC with Differential/Platelet - IGP, Aptima HPV, rfx 16/18,45  3. Vitamin D deficiency - CMP14+EGFR - CBC with Differential/Platelet - VITAMIN D 25 Hydroxy (Vit-D Deficiency, Fractures)  4. Hyperlipidemia, unspecified hyperlipidemia type - CMP14+EGFR - CBC with Differential/Platelet - Lipid panel   Labs pending Health Maintenance reviewed- Flu vaccine given today Diet and exercise encouraged  Follow up plan: 1 year    Nicole Dun, FNP

## 2018-09-10 NOTE — Patient Instructions (Signed)

## 2018-09-11 LAB — CMP14+EGFR
ALBUMIN: 5 g/dL (ref 3.5–5.5)
ALT: 18 IU/L (ref 0–32)
AST: 20 IU/L (ref 0–40)
Albumin/Globulin Ratio: 2.2 (ref 1.2–2.2)
Alkaline Phosphatase: 82 IU/L (ref 39–117)
BILIRUBIN TOTAL: 0.4 mg/dL (ref 0.0–1.2)
BUN / CREAT RATIO: 21 (ref 9–23)
BUN: 16 mg/dL (ref 6–24)
CALCIUM: 9.7 mg/dL (ref 8.7–10.2)
CHLORIDE: 103 mmol/L (ref 96–106)
CO2: 21 mmol/L (ref 20–29)
Creatinine, Ser: 0.75 mg/dL (ref 0.57–1.00)
GFR, EST AFRICAN AMERICAN: 105 mL/min/{1.73_m2} (ref >59)
GFR, EST NON AFRICAN AMERICAN: 91 mL/min/{1.73_m2} (ref >59)
GLUCOSE: 87 mg/dL (ref 65–99)
Globulin, Total: 2.3 g/dL (ref 1.5–4.5)
Potassium: 3.8 mmol/L (ref 3.5–5.2)
Sodium: 141 mmol/L (ref 134–144)
TOTAL PROTEIN: 7.3 g/dL (ref 6.0–8.5)

## 2018-09-11 LAB — CBC WITH DIFFERENTIAL/PLATELET
BASOS ABS: 0 10*3/uL (ref 0.0–0.2)
BASOS: 1 %
EOS (ABSOLUTE): 0.1 10*3/uL (ref 0.0–0.4)
EOS: 1 %
HEMOGLOBIN: 14.3 g/dL (ref 11.1–15.9)
Hematocrit: 41 % (ref 34.0–46.6)
IMMATURE GRANS (ABS): 0 10*3/uL (ref 0.0–0.1)
Immature Granulocytes: 0 %
LYMPHS ABS: 2.5 10*3/uL (ref 0.7–3.1)
LYMPHS: 46 %
MCH: 33.8 pg — AB (ref 26.6–33.0)
MCHC: 34.9 g/dL (ref 31.5–35.7)
MCV: 97 fL (ref 79–97)
Monocytes Absolute: 0.5 10*3/uL (ref 0.1–0.9)
Monocytes: 9 %
NEUTROS ABS: 2.3 10*3/uL (ref 1.4–7.0)
Neutrophils: 43 %
PLATELETS: 253 10*3/uL (ref 150–450)
RBC: 4.23 x10E6/uL (ref 3.77–5.28)
RDW: 11.8 % — ABNORMAL LOW (ref 12.3–15.4)
WBC: 5.4 10*3/uL (ref 3.4–10.8)

## 2018-09-11 LAB — TSH: TSH: 1.94 u[IU]/mL (ref 0.450–4.500)

## 2018-09-11 LAB — LIPID PANEL
CHOLESTEROL TOTAL: 215 mg/dL — AB (ref 100–199)
Chol/HDL Ratio: 2.9 ratio (ref 0.0–4.4)
HDL: 73 mg/dL (ref >39)
LDL CALC: 119 mg/dL — AB (ref 0–99)
Triglycerides: 116 mg/dL (ref 0–149)
VLDL CHOLESTEROL CAL: 23 mg/dL (ref 5–40)

## 2018-09-11 LAB — VITAMIN D 25 HYDROXY (VIT D DEFICIENCY, FRACTURES): Vit D, 25-Hydroxy: 41.6 ng/mL (ref 30.0–100.0)

## 2018-09-15 LAB — IGP, APTIMA HPV, RFX 16/18,45: HPV Aptima: NEGATIVE

## 2018-10-05 ENCOUNTER — Other Ambulatory Visit: Payer: Self-pay | Admitting: Family

## 2018-10-05 DIAGNOSIS — J309 Allergic rhinitis, unspecified: Secondary | ICD-10-CM

## 2018-10-05 DIAGNOSIS — H1011 Acute atopic conjunctivitis, right eye: Secondary | ICD-10-CM

## 2018-10-06 ENCOUNTER — Other Ambulatory Visit: Payer: Self-pay | Admitting: Family Medicine

## 2018-10-06 DIAGNOSIS — Z1231 Encounter for screening mammogram for malignant neoplasm of breast: Secondary | ICD-10-CM

## 2018-10-19 ENCOUNTER — Ambulatory Visit
Admission: RE | Admit: 2018-10-19 | Discharge: 2018-10-19 | Disposition: A | Discharge status: Home / Self Care | Source: Ambulatory Visit | Attending: Family Medicine | Admitting: Family Medicine

## 2018-10-19 DIAGNOSIS — Z1231 Encounter for screening mammogram for malignant neoplasm of breast: Secondary | ICD-10-CM

## 2019-01-27 ENCOUNTER — Other Ambulatory Visit: Payer: Self-pay | Admitting: Family

## 2019-01-27 DIAGNOSIS — E785 Hyperlipidemia, unspecified: Secondary | ICD-10-CM

## 2019-01-27 NOTE — Telephone Encounter (Signed)
Last lipid 09/10/18  Last Vit D 02/12/17  21.9

## 2019-03-01 ENCOUNTER — Other Ambulatory Visit: Payer: Self-pay | Admitting: Family

## 2019-03-01 DIAGNOSIS — E785 Hyperlipidemia, unspecified: Secondary | ICD-10-CM

## 2019-04-05 ENCOUNTER — Other Ambulatory Visit: Payer: Self-pay | Admitting: Family

## 2019-04-05 DIAGNOSIS — E785 Hyperlipidemia, unspecified: Secondary | ICD-10-CM

## 2019-04-06 NOTE — Telephone Encounter (Signed)
09/10/18 OV rtc 1 yr

## 2019-04-18 ENCOUNTER — Other Ambulatory Visit: Payer: Self-pay | Admitting: Family

## 2019-07-10 ENCOUNTER — Other Ambulatory Visit: Payer: Self-pay | Admitting: Family

## 2019-07-14 ENCOUNTER — Other Ambulatory Visit: Payer: Self-pay | Admitting: Family

## 2019-07-21 ENCOUNTER — Other Ambulatory Visit: Payer: Self-pay

## 2019-07-22 ENCOUNTER — Other Ambulatory Visit: Payer: Self-pay | Admitting: Family

## 2019-07-22 MED ORDER — VITAMIN D (ERGOCALCIFEROL) 1.25 MG (50000 UNIT) PO CAPS: 50000.0000 [IU] | ORAL_CAPSULE | ORAL | 0 refills | Status: DC

## 2019-07-22 NOTE — Telephone Encounter (Signed)
Last vit d 09/2018 in normal range

## 2019-07-28 ENCOUNTER — Encounter: Payer: Self-pay | Admitting: Family

## 2019-07-28 ENCOUNTER — Ambulatory Visit (INDEPENDENT_AMBULATORY_CARE_PROVIDER_SITE_OTHER): Admitting: Family

## 2019-07-28 DIAGNOSIS — B880 Other acariasis: Secondary | ICD-10-CM | POA: Diagnosis not present

## 2019-07-28 MED ORDER — TRIAMCINOLONE ACETONIDE 0.5 % EX OINT: 1.0000 "application " | TOPICAL_OINTMENT | Freq: Two times a day (BID) | CUTANEOUS | 0 refills | Status: DC

## 2019-07-28 MED ORDER — HYDROXYZINE PAMOATE 25 MG PO CAPS: 25.0000 mg | ORAL_CAPSULE | Freq: Three times a day (TID) | ORAL | 1 refills | Status: DC | PRN

## 2019-07-28 NOTE — Progress Notes (Signed)
   Virtual Visit via telephone Note Due to COVID-19 pandemic this visit was conducted virtually. This visit type was conducted due to national recommendations for restrictions regarding the COVID-19 Pandemic (e.g. social distancing, sheltering in place) in an effort to limit this patient's exposure and mitigate transmission in our community. All issues noted in this document were discussed and addressed.  A physical exam was not performed with this format.  I connected with Nicole Benitez on 07/28/19 at 11:40 AM  by telephone and verified that I am speaking with the correct person using two identifiers. Nicole Benitez is currently located at home  and no one  is currently with her  during visit. The provider, Evelina Dun, FNP is located in their office at time of visit.  I discussed the limitations, risks, security and privacy concerns of performing an evaluation and management service by telephone and the availability of in person appointments. I also discussed with the patient that there may be a patient responsible charge related to this service. The patient expressed understanding and agreed to proceed.   History and Present Illness:  Rash This is a new problem. The current episode started in the past 7 days. The problem is unchanged. The affected locations include the left foot and right foot. The rash is characterized by itchiness and redness. She was exposed to an insect bite/sting (walked in the woods Tuesday evening). Pertinent negatives include no cough, diarrhea, eye pain, fever, joint pain or nail changes. Past treatments include antibiotic cream and anti-itch cream. The treatment provided mild relief.      Review of Systems  Constitutional: Negative for fever.  Eyes: Negative for pain.  Respiratory: Negative for cough.   Gastrointestinal: Negative for diarrhea.  Musculoskeletal: Negative for joint pain.  Skin: Positive for rash. Negative for nail changes.      Observations/Objective: No SOB or distress noted, picture reviewed that was sent to MyChart. Vesicle like rash on bilateral feet.   Assessment and Plan: 1. Chigger bite Do not scratch  Avoid grassy fields Wash socks and shoes and dry them. Avoid wearing for next 3 days RTO if symptoms worsen or do not improve  - hydrOXYzine (VISTARIL) 25 MG capsule; Take 1 capsule (25 mg total) by mouth 3 (three) times daily as needed.  Dispense: 60 capsule; Refill: 1 - triamcinolone ointment (KENALOG) 0.5 %; Apply 1 application topically 2 (two) times daily.  Dispense: 60 g; Refill: 0     I discussed the assessment and treatment plan with the patient. The patient was provided an opportunity to ask questions and all were answered. The patient agreed with the plan and demonstrated an understanding of the instructions.   The patient was advised to call back or seek an in-person evaluation if the symptoms worsen or if the condition fails to improve as anticipated.  The above assessment and management plan was discussed with the patient. The patient verbalized understanding of and has agreed to the management plan. Patient is aware to call the clinic if symptoms persist or worsen. Patient is aware when to return to the clinic for a follow-up visit. Patient educated on when it is appropriate to go to the emergency department.   Time call ended:  11:55 AM   I provided 15 minutes of non-face-to-face time during this encounter.    Evelina Dun, FNP

## 2019-09-09 ENCOUNTER — Other Ambulatory Visit: Payer: Self-pay

## 2019-09-12 ENCOUNTER — Encounter: Payer: Self-pay | Admitting: Family

## 2019-09-12 ENCOUNTER — Other Ambulatory Visit: Payer: Self-pay

## 2019-09-12 ENCOUNTER — Ambulatory Visit (INDEPENDENT_AMBULATORY_CARE_PROVIDER_SITE_OTHER): Admitting: Family

## 2019-09-12 VITALS — BP 120/82 | HR 83 | Temp 97.3°F | Ht 64.0 in | Wt 140.8 lb

## 2019-09-12 DIAGNOSIS — Z0000 Encounter for general adult medical examination without abnormal findings: Secondary | ICD-10-CM

## 2019-09-12 DIAGNOSIS — E785 Hyperlipidemia, unspecified: Secondary | ICD-10-CM | POA: Diagnosis not present

## 2019-09-12 DIAGNOSIS — E559 Vitamin D deficiency, unspecified: Secondary | ICD-10-CM | POA: Diagnosis not present

## 2019-09-12 DIAGNOSIS — Z0001 Encounter for general adult medical examination with abnormal findings: Secondary | ICD-10-CM | POA: Diagnosis not present

## 2019-09-12 DIAGNOSIS — Z23 Encounter for immunization: Secondary | ICD-10-CM | POA: Diagnosis not present

## 2019-09-12 NOTE — Patient Instructions (Signed)
Health Maintenance, Female Adopting a healthy lifestyle and getting preventive care are important in promoting health and wellness. Ask your health care provider about:  The right schedule for you to have regular tests and exams.  Things you can do on your own to prevent diseases and keep yourself healthy. What should I know about diet, weight, and exercise? Eat a healthy diet   Eat a diet that includes plenty of vegetables, fruits, low-fat dairy products, and lean protein.  Do not eat a lot of foods that are high in solid fats, added sugars, or sodium. Maintain a healthy weight Body mass index (BMI) is used to identify weight problems. It estimates body fat based on height and weight. Your health care provider can help determine your BMI and help you achieve or maintain a healthy weight. Get regular exercise Get regular exercise. This is one of the most important things you can do for your health. Most adults should:  Exercise for at least 150 minutes each week. The exercise should increase your heart rate and make you sweat (moderate-intensity exercise).  Do strengthening exercises at least twice a week. This is in addition to the moderate-intensity exercise.  Spend less time sitting. Even light physical activity can be beneficial. Watch cholesterol and blood lipids Have your blood tested for lipids and cholesterol at 55 years of age, then have this test every 5 years. Have your cholesterol levels checked more often if:  Your lipid or cholesterol levels are high.  You are older than 55 years of age.  You are at high risk for heart disease. What should I know about cancer screening? Depending on your health history and family history, you may need to have cancer screening at various ages. This may include screening for:  Breast cancer.  Cervical cancer.  Colorectal cancer.  Skin cancer.  Lung cancer. What should I know about heart disease, diabetes, and high blood  pressure? Blood pressure and heart disease  High blood pressure causes heart disease and increases the risk of stroke. This is more likely to develop in people who have high blood pressure readings, are of African descent, or are overweight.  Have your blood pressure checked: ? Every 3-5 years if you are 18-39 years of age. ? Every year if you are 40 years old or older. Diabetes Have regular diabetes screenings. This checks your fasting blood sugar level. Have the screening done:  Once every three years after age 40 if you are at a normal weight and have a low risk for diabetes.  More often and at a younger age if you are overweight or have a high risk for diabetes. What should I know about preventing infection? Hepatitis B If you have a higher risk for hepatitis B, you should be screened for this virus. Talk with your health care provider to find out if you are at risk for hepatitis B infection. Hepatitis C Testing is recommended for:  Everyone born from 1945 through 1965.  Anyone with known risk factors for hepatitis C. Sexually transmitted infections (STIs)  Get screened for STIs, including gonorrhea and chlamydia, if: ? You are sexually active and are younger than 55 years of age. ? You are older than 55 years of age and your health care provider tells you that you are at risk for this type of infection. ? Your sexual activity has changed since you were last screened, and you are at increased risk for chlamydia or gonorrhea. Ask your health care provider if   you are at risk.  Ask your health care provider about whether you are at high risk for HIV. Your health care provider may recommend a prescription medicine to help prevent HIV infection. If you choose to take medicine to prevent HIV, you should first get tested for HIV. You should then be tested every 3 months for as long as you are taking the medicine. Pregnancy  If you are about to stop having your period (premenopausal) and  you may become pregnant, seek counseling before you get pregnant.  Take 400 to 800 micrograms (mcg) of folic acid every day if you become pregnant.  Ask for birth control (contraception) if you want to prevent pregnancy. Osteoporosis and menopause Osteoporosis is a disease in which the bones lose minerals and strength with aging. This can result in bone fractures. If you are 65 years old or older, or if you are at risk for osteoporosis and fractures, ask your health care provider if you should:  Be screened for bone loss.  Take a calcium or vitamin D supplement to lower your risk of fractures.  Be given hormone replacement therapy (HRT) to treat symptoms of menopause. Follow these instructions at home: Lifestyle  Do not use any products that contain nicotine or tobacco, such as cigarettes, e-cigarettes, and chewing tobacco. If you need help quitting, ask your health care provider.  Do not use street drugs.  Do not share needles.  Ask your health care provider for help if you need support or information about quitting drugs. Alcohol use  Do not drink alcohol if: ? Your health care provider tells you not to drink. ? You are pregnant, may be pregnant, or are planning to become pregnant.  If you drink alcohol: ? Limit how much you use to 0-1 drink a day. ? Limit intake if you are breastfeeding.  Be aware of how much alcohol is in your drink. In the [place] one drink equals one 12 oz bottle of beer (355 mL), one 5 oz glass of wine (148 mL), or one 1 oz glass of hard liquor (44 mL). General instructions  Schedule regular health, dental, and eye exams.  Stay current with your vaccines.  Tell your health care provider if: ? You often feel depressed. ? You have ever been abused or do not feel safe at home. Summary  Adopting a healthy lifestyle and getting preventive care are important in promoting health and wellness.  Follow your health care provider's instructions about healthy  diet, exercising, and getting tested or screened for diseases.  Follow your health care provider's instructions on monitoring your cholesterol and blood pressure. This information is not intended to replace advice given to you by your health care provider. Make sure you discuss any questions you have with your health care provider. Document Released: 05/12/2011 Document Revised: 10/20/2018 Document Reviewed: 10/20/2018 Elsevier Patient Education  2020 [employer]  

## 2019-09-12 NOTE — Progress Notes (Signed)
Subjective:    Patient ID: Nicole Benitez, female    DOB: 1966-10-27, 53 y.o.   MRN: [id number]  Chief Complaint  Patient presents with  . Annual Exam   Pt presents to the office today for CPE without pap.  Hyperlipidemia This is a chronic problem. The current episode started more than 1 year ago. The problem is controlled. Recent lipid tests were reviewed and are normal. Current antihyperlipidemic treatment includes statins. The current treatment provides mild improvement of lipids. Risk factors for coronary artery disease include dyslipidemia and a sedentary lifestyle.      Review of Systems  All other systems reviewed and are negative.  Family History  Problem Relation Age of Onset  . Hyperlipidemia Mother   . Hypertension Mother   . Cancer Father        back cancer  . Hypertension Brother   . Colon cancer Neg Hx   . Colon polyps Neg Hx   . Esophageal cancer Neg Hx   . Rectal cancer Neg Hx   . Stomach cancer Neg Hx   . Breast cancer Neg Hx    Social History   Socioeconomic History  . Marital status: Married    Spouse name: Not on file  . Number of children: Not on file  . Years of education: Not on file  . Highest education level: Not on file  Occupational History  . Not on file  Social Needs  . Financial resource strain: Not on file  . Food insecurity    Worry: Not on file    Inability: Not on file  . Transportation needs    Medical: Not on file    Non-medical: Not on file  Tobacco Use  . Smoking status: Never Smoker  . Smokeless tobacco: Never Used  Substance and Sexual Activity  . Alcohol use: No    Alcohol/week: 0.0 standard drinks  . Drug use: No  . Sexual activity: Not on file  Lifestyle  . Physical activity    Days per week: Not on file    Minutes per session: Not on file  . Stress: Not on file  Relationships  . Social Herbalist on phone: Not on file    Gets together: Not on file    Attends religious service: Not on file   Active member of club or organization: Not on file    Attends meetings of clubs or organizations: Not on file    Relationship status: Not on file  Other Topics Concern  . Not on file  Social History Narrative  . Not on file        Objective:   Physical Exam Vitals signs reviewed.  Constitutional:      General: She is not in acute distress.    Appearance: She is well-developed.  HENT:     Head: Normocephalic and atraumatic.     Right Ear: Tympanic membrane normal.     Left Ear: Tympanic membrane normal.  Eyes:     Pupils: Pupils are equal, round, and reactive to light.  Neck:     Musculoskeletal: Normal range of motion and neck supple.     Thyroid: No thyromegaly.  Cardiovascular:     Rate and Rhythm: Normal rate and regular rhythm.     Heart sounds: Normal heart sounds. No murmur.  Pulmonary:     Effort: Pulmonary effort is normal. No respiratory distress.     Breath sounds: Normal breath sounds. No wheezing.  Abdominal:  General: Bowel sounds are normal. There is no distension.     Palpations: Abdomen is soft.     Tenderness: There is no abdominal tenderness.  Musculoskeletal: Normal range of motion.        General: No tenderness.  Skin:    General: Skin is warm and dry.  Neurological:     Mental Status: She is alert and oriented to person, place, and time.     Cranial Nerves: No cranial nerve deficit.     Deep Tendon Reflexes: Reflexes are normal and symmetric.  Psychiatric:        Behavior: Behavior normal.        Thought Content: Thought content normal.        Judgment: Judgment normal.       BP 120/82   Pulse 83   Temp (!) 97.3 F (36.3 C) (Temporal)   Ht 5' 4" (1.626 m)   Wt 140 lb 12.8 oz (63.9 kg)   LMP 09/04/2015 Comment: irregular   SpO2 98%   BMI 24.17 kg/m      Assessment & Plan:  Nicole Benitez comes in today with chief complaint of Annual Exam   Diagnosis and orders addressed:  1. Hyperlipidemia, unspecified hyperlipidemia type -  CMP14+EGFR - CBC with Differential/Platelet  2. Vitamin D deficiency - CMP14+EGFR - CBC with Differential/Platelet - VITAMIN D 25 Hydroxy (Vit-D Deficiency, Fractures)  3. Annual physical exam - CMP14+EGFR - CBC with Differential/Platelet - Lipid panel - TSH - VITAMIN D 25 Hydroxy (Vit-D Deficiency, Fractures)   Labs pending Health Maintenance reviewed Diet and exercise encouraged  Follow up plan: 1 year     Evelina Dun, FNP

## 2019-09-13 LAB — CMP14+EGFR
ALT: 29 IU/L (ref 0–32)
AST: 23 IU/L (ref 0–40)
Albumin/Globulin Ratio: 2.1 (ref 1.2–2.2)
Albumin: 4.9 g/dL (ref 3.8–4.9)
Alkaline Phosphatase: 83 IU/L (ref 39–117)
BUN/Creatinine Ratio: 17 (ref 9–23)
BUN: 11 mg/dL (ref 6–24)
Bilirubin Total: 0.5 mg/dL (ref 0.0–1.2)
CO2: 22 mmol/L (ref 20–29)
Calcium: 9.3 mg/dL (ref 8.7–10.2)
Chloride: 103 mmol/L (ref 96–106)
Creatinine, Ser: 0.66 mg/dL (ref 0.57–1.00)
GFR calc Af Amer: 115 mL/min/{1.73_m2} (ref >59)
GFR calc non Af Amer: 100 mL/min/{1.73_m2} (ref >59)
Globulin, Total: 2.3 g/dL (ref 1.5–4.5)
Glucose: 86 mg/dL (ref 65–99)
Potassium: 3.9 mmol/L (ref 3.5–5.2)
Sodium: 143 mmol/L (ref 134–144)
Total Protein: 7.2 g/dL (ref 6.0–8.5)

## 2019-09-13 LAB — CBC WITH DIFFERENTIAL/PLATELET
Basophils Absolute: 0 10*3/uL (ref 0.0–0.2)
Basos: 1 %
EOS (ABSOLUTE): 0.1 10*3/uL (ref 0.0–0.4)
Eos: 1 %
Hematocrit: 40.5 % (ref 34.0–46.6)
Hemoglobin: 14.3 g/dL (ref 11.1–15.9)
Immature Grans (Abs): 0 10*3/uL (ref 0.0–0.1)
Immature Granulocytes: 0 %
Lymphocytes Absolute: 3.5 10*3/uL — ABNORMAL HIGH (ref 0.7–3.1)
Lymphs: 48 %
MCH: 34.5 pg — ABNORMAL HIGH (ref 26.6–33.0)
MCHC: 35.3 g/dL (ref 31.5–35.7)
MCV: 98 fL — ABNORMAL HIGH (ref 79–97)
Monocytes Absolute: 0.6 10*3/uL (ref 0.1–0.9)
Monocytes: 7 %
Neutrophils Absolute: 3.2 10*3/uL (ref 1.4–7.0)
Neutrophils: 43 %
Platelets: 235 10*3/uL (ref 150–450)
RBC: 4.14 x10E6/uL (ref 3.77–5.28)
RDW: 11.7 % (ref 11.7–15.4)
WBC: 7.4 10*3/uL (ref 3.4–10.8)

## 2019-09-13 LAB — LIPID PANEL
Chol/HDL Ratio: 2.3 ratio (ref 0.0–4.4)
Cholesterol, Total: 163 mg/dL (ref 100–199)
HDL: 70 mg/dL (ref >39)
LDL Chol Calc (NIH): 73 mg/dL (ref 0–99)
Triglycerides: 113 mg/dL (ref 0–149)
VLDL Cholesterol Cal: 20 mg/dL (ref 5–40)

## 2019-09-13 LAB — TSH: TSH: 1.49 u[IU]/mL (ref 0.450–4.500)

## 2019-09-13 LAB — VITAMIN D 25 HYDROXY (VIT D DEFICIENCY, FRACTURES): Vit D, 25-Hydroxy: 47.4 ng/mL (ref 30.0–100.0)

## 2019-10-08 ENCOUNTER — Other Ambulatory Visit: Payer: Self-pay | Admitting: Family

## 2019-10-08 DIAGNOSIS — J309 Allergic rhinitis, unspecified: Secondary | ICD-10-CM

## 2019-10-08 DIAGNOSIS — E785 Hyperlipidemia, unspecified: Secondary | ICD-10-CM

## 2019-10-08 DIAGNOSIS — H1011 Acute atopic conjunctivitis, right eye: Secondary | ICD-10-CM

## 2019-11-21 ENCOUNTER — Other Ambulatory Visit: Payer: Self-pay | Admitting: Family

## 2019-11-21 ENCOUNTER — Other Ambulatory Visit: Payer: Self-pay | Admitting: *Deleted

## 2019-11-21 DIAGNOSIS — Z1231 Encounter for screening mammogram for malignant neoplasm of breast: Secondary | ICD-10-CM

## 2019-11-23 ENCOUNTER — Other Ambulatory Visit: Payer: Self-pay

## 2019-11-23 ENCOUNTER — Ambulatory Visit
Admission: RE | Admit: 2019-11-23 | Discharge: 2019-11-23 | Disposition: A | Discharge status: Home / Self Care | Source: Ambulatory Visit | Attending: Family | Admitting: Family

## 2019-11-23 DIAGNOSIS — Z1231 Encounter for screening mammogram for malignant neoplasm of breast: Secondary | ICD-10-CM

## 2020-01-04 ENCOUNTER — Other Ambulatory Visit: Payer: Self-pay | Admitting: Family

## 2020-01-04 DIAGNOSIS — J309 Allergic rhinitis, unspecified: Secondary | ICD-10-CM

## 2020-01-04 DIAGNOSIS — H1011 Acute atopic conjunctivitis, right eye: Secondary | ICD-10-CM

## 2020-01-06 ENCOUNTER — Other Ambulatory Visit: Payer: Self-pay | Admitting: Family

## 2020-01-06 DIAGNOSIS — E785 Hyperlipidemia, unspecified: Secondary | ICD-10-CM

## 2020-01-10 ENCOUNTER — Other Ambulatory Visit: Payer: Self-pay

## 2020-01-10 MED ORDER — VITAMIN D (ERGOCALCIFEROL) 1.25 MG (50000 UNIT) PO CAPS: 50000.0000 [IU] | ORAL_CAPSULE | ORAL | 0 refills | Status: DC

## 2020-04-16 ENCOUNTER — Other Ambulatory Visit: Payer: Self-pay | Admitting: Family

## 2020-04-16 DIAGNOSIS — E785 Hyperlipidemia, unspecified: Secondary | ICD-10-CM

## 2020-04-16 NOTE — Telephone Encounter (Signed)
OV 09/11/20 RTC 1 yr

## 2020-04-18 ENCOUNTER — Other Ambulatory Visit: Payer: Self-pay | Admitting: Family

## 2020-04-18 ENCOUNTER — Telehealth: Payer: Self-pay | Admitting: Family

## 2020-04-18 NOTE — Telephone Encounter (Signed)
Does patient need to continue?  Last vit d 09/2019-Normal

## 2020-04-18 NOTE — Telephone Encounter (Signed)
Vitamin D and cholesterol reviewed.

## 2020-04-18 NOTE — Telephone Encounter (Signed)
Pt states that she would like a call regarding her vitamin D labs and the result being good and no longer needing to take vitamin D. She states she was unaware until the refill request this month.

## 2020-07-06 ENCOUNTER — Telehealth: Payer: Self-pay | Admitting: Family

## 2020-07-06 NOTE — Telephone Encounter (Signed)
Patient complains of cough, nasal drainage and scratchy throat for 3 days.  No Fevers and No SOB.  Patient has been taking Dayquil and Nyquil with no relief.  Patient has also received COVID vaccines in May and she has not recently been tested.  No openings until Monday afternoon in [facility].

## 2020-07-09 ENCOUNTER — Encounter: Payer: Self-pay | Admitting: Family

## 2020-07-09 ENCOUNTER — Ambulatory Visit (INDEPENDENT_AMBULATORY_CARE_PROVIDER_SITE_OTHER): Admitting: Family

## 2020-07-09 DIAGNOSIS — J209 Acute bronchitis, unspecified: Secondary | ICD-10-CM

## 2020-07-09 MED ORDER — PREDNISONE 10 MG (21) PO TBPK: ORAL_TABLET | ORAL | 0 refills | Status: DC

## 2020-07-09 NOTE — Progress Notes (Signed)
Virtual Visit via telephone Note Due to COVID-19 pandemic this visit was conducted virtually. This visit type was conducted due to national recommendations for restrictions regarding the COVID-19 Pandemic (e.g. social distancing, sheltering in place) in an effort to limit this patient's exposure and mitigate transmission in our community. All issues noted in this document were discussed and addressed.  A physical exam was not performed with this format.  I connected with Nicole Benitez on 07/09/20 at 11:44 AM  by telephone and verified that I am speaking with the correct person using two identifiers. Nicole Benitez is currently located at car and son is currently with her during visit. The provider, Evelina Dun, FNP is located in their office at time of visit.  I discussed the limitations, risks, security and privacy concerns of performing an evaluation and management service by telephone and the availability of in person appointments. I also discussed with the patient that there may be a patient responsible charge related to this service. The patient expressed understanding and agreed to proceed.   History and Present Illness:   Pt calls the office with cough that started last week. She had a COVID test that was negative.  Cough This is a new problem. The current episode started 1 to 4 weeks ago. The problem has been gradually worsening. The problem occurs every few minutes. The cough is productive of purulent sputum. Associated symptoms include ear congestion, nasal congestion, postnasal drip and a sore throat. Pertinent negatives include no chills, ear pain, fever, headaches, myalgias, rhinorrhea, shortness of breath or wheezing. The symptoms are aggravated by lying down. She has tried OTC cough suppressant and rest for the symptoms. The treatment provided mild relief.      Review of Systems  Constitutional: Negative for chills and fever.  HENT: Positive for postnasal drip and sore throat.  Negative for ear pain and rhinorrhea.   Respiratory: Positive for cough. Negative for shortness of breath and wheezing.   Musculoskeletal: Negative for myalgias.  Neurological: Negative for headaches.     Observations/Objective: No SOB or distress noted   Assessment and Plan: 1. Acute bronchitis, unspecified organism - Take meds as prescribed - Use a cool mist humidifier  -Use saline nose sprays frequently -Force fluids -For any cough or congestion  Use plain Mucinex- regular strength or max strength is fine -For fever or aces or pains- take tylenol or ibuprofen. -Throat lozenges if help -New toothbrush in 3 days -Call if symptoms worsen or do not improve  - predniSONE (STERAPRED UNI-PAK 21 TAB) 10 MG (21) TBPK tablet; Use as directed  Dispense: 21 tablet; Refill: 0     I discussed the assessment and treatment plan with the patient. The patient was provided an opportunity to ask questions and all were answered. The patient agreed with the plan and demonstrated an understanding of the instructions.   The patient was advised to call back or seek an in-person evaluation if the symptoms worsen or if the condition fails to improve as anticipated.  The above assessment and management plan was discussed with the patient. The patient verbalized understanding of and has agreed to the management plan. Patient is aware to call the clinic if symptoms persist or worsen. Patient is aware when to return to the clinic for a follow-up visit. Patient educated on when it is appropriate to go to the emergency department.   Time call ended: 11:50 AM   I provided 6 minutes of non-face-to-face time during this encounter.  Evelina Dun, FNP

## 2020-07-09 NOTE — Telephone Encounter (Signed)
Please let her know that she can take flonase and nasal saline and mucinex to help

## 2020-07-09 NOTE — Telephone Encounter (Signed)
Patient states that she has been taking all of the recommended medications (Mucinex, nasal spray and cough syrup) with no relief.  Patient also had negative COVID test on Saturday.  Televisit appt made, patient aware.

## 2020-07-19 ENCOUNTER — Other Ambulatory Visit: Payer: Self-pay | Admitting: Family

## 2020-07-19 MED ORDER — AZITHROMYCIN 250 MG PO TABS: ORAL_TABLET | ORAL | 0 refills | Status: DC

## 2020-09-13 ENCOUNTER — Other Ambulatory Visit: Payer: Self-pay

## 2020-09-13 ENCOUNTER — Ambulatory Visit (INDEPENDENT_AMBULATORY_CARE_PROVIDER_SITE_OTHER): Admitting: Family

## 2020-09-13 ENCOUNTER — Encounter: Payer: Self-pay | Admitting: Family

## 2020-09-13 VITALS — BP 121/79 | HR 71 | Temp 97.8°F | Ht 64.0 in | Wt 142.2 lb

## 2020-09-13 DIAGNOSIS — Z0000 Encounter for general adult medical examination without abnormal findings: Secondary | ICD-10-CM

## 2020-09-13 DIAGNOSIS — Z0001 Encounter for general adult medical examination with abnormal findings: Secondary | ICD-10-CM

## 2020-09-13 DIAGNOSIS — Z23 Encounter for immunization: Secondary | ICD-10-CM | POA: Diagnosis not present

## 2020-09-13 DIAGNOSIS — E785 Hyperlipidemia, unspecified: Secondary | ICD-10-CM

## 2020-09-13 DIAGNOSIS — H1011 Acute atopic conjunctivitis, right eye: Secondary | ICD-10-CM

## 2020-09-13 DIAGNOSIS — J309 Allergic rhinitis, unspecified: Secondary | ICD-10-CM

## 2020-09-13 DIAGNOSIS — E559 Vitamin D deficiency, unspecified: Secondary | ICD-10-CM

## 2020-09-13 MED ORDER — CETIRIZINE HCL 10 MG PO TABS: 10.0000 mg | ORAL_TABLET | Freq: Every day | ORAL | 2 refills | Status: DC

## 2020-09-13 MED ORDER — FLUTICASONE PROPIONATE 50 MCG/ACT NA SUSP: 2.0000 | Freq: Every day | NASAL | 6 refills | Status: DC

## 2020-09-13 MED ORDER — VITAMIN D (ERGOCALCIFEROL) 1.25 MG (50000 UNIT) PO CAPS: 50000.0000 [IU] | ORAL_CAPSULE | ORAL | 3 refills | Status: DC

## 2020-09-13 MED ORDER — SIMVASTATIN 20 MG PO TABS: 20.0000 mg | ORAL_TABLET | Freq: Every day | ORAL | 4 refills | Status: DC

## 2020-09-13 NOTE — Patient Instructions (Signed)
Vitamin D Deficiency Vitamin D deficiency is when your body does not have enough vitamin D. Vitamin D is important to your body for many reasons:  It helps the body absorb two important minerals--calcium and phosphorus.  It plays a role in bone health.  It may help to prevent some diseases, such as diabetes and multiple sclerosis.  It plays a role in muscle function, including heart function. If vitamin D deficiency is severe, it can cause a condition in which your bones become soft. In adults, this condition is called osteomalacia. In children, this condition is called rickets. What are the causes? This condition may be caused by:  Not eating enough foods that contain vitamin D.  Not getting enough natural sun exposure.  Having certain digestive system diseases that make it difficult for your body to absorb vitamin D. These diseases include Crohn's disease, chronic pancreatitis, and cystic fibrosis.  Having a surgery in which a part of the stomach or a part of the small intestine is removed.  Having chronic kidney disease or liver disease. What increases the risk? You are more likely to develop this condition if you:  Are older.  Do not spend much time outdoors.  Live in a long-term care facility.  Have had broken bones.  Have weak or thin bones (osteoporosis).  Have a disease or condition that changes how the body absorbs vitamin D.  Have dark skin.  Take certain medicines, such as steroid medicines or certain seizure medicines.  Are overweight or obese. What are the signs or symptoms? In mild cases of vitamin D deficiency, there may not be any symptoms. If the condition is severe, symptoms may include:  Bone pain.  Muscle pain.  Falling often.  Broken bones caused by a minor injury. How is this diagnosed? This condition may be diagnosed with blood tests. Imaging tests such as X-rays may also be done to look for changes in the bone. How is this  treated? Treatment for this condition may depend on what caused the condition. Treatment options include:  Taking vitamin D supplements. Your health care provider will suggest what dose is best for you.  Taking a calcium supplement. Your health care provider will suggest what dose is best for you. Follow these instructions at home: Eating and drinking   Eat foods that contain vitamin D. Choices include: ? Fortified dairy products, cereals, or juices. Fortified means that vitamin D has been added to the food. Check the label on the package to see if the food is fortified. ? Fatty fish, such as salmon or trout. ? Eggs. ? Oysters. ? Mushrooms. The items listed above may not be a complete list of recommended foods and beverages. Contact a dietitian for more information. General instructions  Take medicines and supplements only as told by your health care provider.  Get regular, safe exposure to natural sunlight.  Do not use a tanning bed.  Maintain a healthy weight. Lose weight if needed.  Keep all follow-up visits as told by your health care provider. This is important. How is this prevented? You can get vitamin D by:  Eating foods that naturally contain vitamin D.  Eating or drinking products that have been fortified with vitamin D, such as cereals, juices, and dairy products (including milk).  Taking a vitamin D supplement or a multivitamin supplement that contains vitamin D.  Being in the sun. Your body naturally makes vitamin D when your skin is exposed to sunlight. Your body changes the sunlight into  a form of the vitamin that it can use. Contact a health care provider if:  Your symptoms do not go away.  You feel nauseous or you vomit.  You have fewer bowel movements than usual or are constipated. Summary  Vitamin D deficiency is when your body does not have enough vitamin D.  Vitamin D is important to your body for good bone health and muscle function, and it may  help prevent some diseases.  Vitamin D deficiency is primarily treated through supplementation. Your health care provider will suggest what dose is best for you.  You can get vitamin D by eating foods that contain vitamin D, by being in the sun, and by taking a vitamin D supplement or a multivitamin supplement that contains vitamin D. This information is not intended to replace advice given to you by your health care provider. Make sure you discuss any questions you have with your health care provider. Document Revised: 07/05/2018 Document Reviewed: 07/05/2018 Elsevier Patient Education  [place].

## 2020-09-13 NOTE — Progress Notes (Signed)
Subjective:    Patient ID: Nicole Benitez, female    DOB: 18-Jun-1966, 54 y.o.   MRN: [id number]  Chief Complaint  Patient presents with  . Annual Exam   Pt presents to the office today for CPE without pap. She states her sister-in-law passed away in Sep 02, 2023 from Uterine Cancer.  Hyperlipidemia This is a chronic problem. The current episode started more than 1 year ago. The problem is controlled. Recent lipid tests were reviewed and are normal. Current antihyperlipidemic treatment includes statins. The current treatment provides moderate improvement of lipids. Risk factors for coronary artery disease include dyslipidemia, a sedentary lifestyle and post-menopausal.      Review of Systems  All other systems reviewed and are negative.  Family History  Problem Relation Age of Onset  . Hyperlipidemia Mother   . Hypertension Mother   . Cancer Father        back cancer  . Hypertension Brother   . Colon cancer Neg Hx   . Colon polyps Neg Hx   . Esophageal cancer Neg Hx   . Rectal cancer Neg Hx   . Stomach cancer Neg Hx   . Breast cancer Neg Hx    Social History   Socioeconomic History  . Marital status: Married    Spouse name: Not on file  . Number of children: Not on file  . Years of education: Not on file  . Highest education level: Not on file  Occupational History  . Not on file  Tobacco Use  . Smoking status: Never Smoker  . Smokeless tobacco: Never Used  Substance and Sexual Activity  . Alcohol use: No    Alcohol/week: 0.0 standard drinks  . Drug use: No  . Sexual activity: Not on file  Other Topics Concern  . Not on file  Social History Narrative  . Not on file   Social Determinants of Health   Financial Resource Strain:   . Difficulty of Paying Living Expenses: Not on file  Food Insecurity:   . Worried About Charity fundraiser in the Last Year: Not on file  . Ran Out of Food in the Last Year: Not on file  Transportation Needs:   . Lack of  Transportation (Medical): Not on file  . Lack of Transportation (Non-Medical): Not on file  Physical Activity:   . Days of Exercise per Week: Not on file  . Minutes of Exercise per Session: Not on file  Stress:   . Feeling of Stress : Not on file  Social Connections:   . Frequency of Communication with Friends and Family: Not on file  . Frequency of Social Gatherings with Friends and Family: Not on file  . Attends Religious Services: Not on file  . Active Member of Clubs or Organizations: Not on file  . Attends Archivist Meetings: Not on file  . Marital Status: Not on file       Objective:   Physical Exam Vitals reviewed.  Constitutional:      General: She is not in acute distress.    Appearance: She is well-developed.  HENT:     Head: Normocephalic and atraumatic.     Right Ear: Tympanic membrane normal.     Left Ear: Tympanic membrane normal.  Eyes:     Pupils: Pupils are equal, round, and reactive to light.  Neck:     Thyroid: No thyromegaly.  Cardiovascular:     Rate and Rhythm: Normal rate and regular rhythm.  Heart sounds: Normal heart sounds. No murmur heard.   Pulmonary:     Effort: Pulmonary effort is normal. No respiratory distress.     Breath sounds: Normal breath sounds. No wheezing.  Abdominal:     General: Bowel sounds are normal. There is no distension.     Palpations: Abdomen is soft.     Tenderness: There is no abdominal tenderness.  Musculoskeletal:        General: No tenderness. Normal range of motion.     Cervical back: Normal range of motion and neck supple.  Skin:    General: Skin is warm and dry.  Neurological:     Mental Status: She is alert and oriented to person, place, and time.     Cranial Nerves: No cranial nerve deficit.     Deep Tendon Reflexes: Reflexes are normal and symmetric.  Psychiatric:        Behavior: Behavior normal.        Thought Content: Thought content normal.        Judgment: Judgment normal.        BP 121/79   Pulse 71   Temp 97.8 F (36.6 C) (Temporal)   Ht 5' 4"  (1.626 m)   Wt 142 lb 3.2 oz (64.5 kg)   LMP 09/04/2015 Comment: irregular   SpO2 98%   BMI 24.41 kg/m      Assessment & Plan:  Nicole Benitez comes in today with chief complaint of Annual Exam   Diagnosis and orders addressed:  1. Allergic rhinitis - cetirizine (EQ ALLERGY RELIEF, CETIRIZINE,) 10 MG tablet; Take 1 tablet (10 mg total) by mouth daily.  Dispense: 90 tablet; Refill: 2 - CMP14+EGFR - CBC with Differential/Platelet - fluticasone (FLONASE) 50 MCG/ACT nasal spray; Place 2 sprays into both nostrils daily.  Dispense: 16 g; Refill: 6  2. Allergic conjunctivitis, right - cetirizine (EQ ALLERGY RELIEF, CETIRIZINE,) 10 MG tablet; Take 1 tablet (10 mg total) by mouth daily.  Dispense: 90 tablet; Refill: 2 - CMP14+EGFR - CBC with Differential/Platelet  3. Hyperlipidemia, unspecified hyperlipidemia type - simvastatin (ZOCOR) 20 MG tablet; Take 1 tablet (20 mg total) by mouth at bedtime.  Dispense: 90 tablet; Refill: 4 - CMP14+EGFR - CBC with Differential/Platelet  4. Annual physical exam - CMP14+EGFR - CBC with Differential/Platelet - Lipid panel - TSH - VITAMIN D 25 Hydroxy (Vit-D Deficiency, Fractures)  5. Vitamin D deficiency - CMP14+EGFR - CBC with Differential/Platelet - VITAMIN D 25 Hydroxy (Vit-D Deficiency, Fractures)   Labs pending Health Maintenance reviewed Diet and exercise encouraged  Follow up plan: 1 year      Evelina Dun, FNP

## 2020-09-14 LAB — CMP14+EGFR
ALT: 24 IU/L (ref 0–32)
AST: 22 IU/L (ref 0–40)
Albumin/Globulin Ratio: 2.2 (ref 1.2–2.2)
Albumin: 5 g/dL — ABNORMAL HIGH (ref 3.8–4.9)
Alkaline Phosphatase: 81 IU/L (ref 44–121)
BUN/Creatinine Ratio: 16 (ref 9–23)
BUN: 12 mg/dL (ref 6–24)
Bilirubin Total: 0.6 mg/dL (ref 0.0–1.2)
CO2: 23 mmol/L (ref 20–29)
Calcium: 9.3 mg/dL (ref 8.7–10.2)
Chloride: 104 mmol/L (ref 96–106)
Creatinine, Ser: 0.74 mg/dL (ref 0.57–1.00)
GFR calc Af Amer: 105 mL/min/{1.73_m2} (ref >59)
GFR calc non Af Amer: 91 mL/min/{1.73_m2} (ref >59)
Globulin, Total: 2.3 g/dL (ref 1.5–4.5)
Glucose: 84 mg/dL (ref 65–99)
Potassium: 4.1 mmol/L (ref 3.5–5.2)
Sodium: 143 mmol/L (ref 134–144)
Total Protein: 7.3 g/dL (ref 6.0–8.5)

## 2020-09-14 LAB — LIPID PANEL
Chol/HDL Ratio: 2.5 ratio (ref 0.0–4.4)
Cholesterol, Total: 189 mg/dL (ref 100–199)
HDL: 75 mg/dL (ref >39)
LDL Chol Calc (NIH): 97 mg/dL (ref 0–99)
Triglycerides: 97 mg/dL (ref 0–149)
VLDL Cholesterol Cal: 17 mg/dL (ref 5–40)

## 2020-09-14 LAB — CBC WITH DIFFERENTIAL/PLATELET
Basophils Absolute: 0 10*3/uL (ref 0.0–0.2)
Basos: 0 %
EOS (ABSOLUTE): 0.1 10*3/uL (ref 0.0–0.4)
Eos: 1 %
Hematocrit: 43.5 % (ref 34.0–46.6)
Hemoglobin: 14.5 g/dL (ref 11.1–15.9)
Immature Grans (Abs): 0 10*3/uL (ref 0.0–0.1)
Immature Granulocytes: 0 %
Lymphocytes Absolute: 3.3 10*3/uL — ABNORMAL HIGH (ref 0.7–3.1)
Lymphs: 48 %
MCH: 33.5 pg — ABNORMAL HIGH (ref 26.6–33.0)
MCHC: 33.3 g/dL (ref 31.5–35.7)
MCV: 101 fL — ABNORMAL HIGH (ref 79–97)
Monocytes Absolute: 0.7 10*3/uL (ref 0.1–0.9)
Monocytes: 9 %
Neutrophils Absolute: 3 10*3/uL (ref 1.4–7.0)
Neutrophils: 42 %
Platelets: 244 10*3/uL (ref 150–450)
RBC: 4.33 x10E6/uL (ref 3.77–5.28)
RDW: 11.9 % (ref 11.7–15.4)
WBC: 7.1 10*3/uL (ref 3.4–10.8)

## 2020-09-14 LAB — TSH: TSH: 1.35 u[IU]/mL (ref 0.450–4.500)

## 2020-09-14 LAB — VITAMIN D 25 HYDROXY (VIT D DEFICIENCY, FRACTURES): Vit D, 25-Hydroxy: 32.6 ng/mL (ref 30.0–100.0)

## 2020-10-08 ENCOUNTER — Other Ambulatory Visit: Payer: Self-pay | Admitting: Family

## 2020-10-08 DIAGNOSIS — J309 Allergic rhinitis, unspecified: Secondary | ICD-10-CM

## 2020-10-08 DIAGNOSIS — H1011 Acute atopic conjunctivitis, right eye: Secondary | ICD-10-CM

## 2020-10-11 ENCOUNTER — Other Ambulatory Visit: Payer: Self-pay | Admitting: Family

## 2020-10-11 DIAGNOSIS — J309 Allergic rhinitis, unspecified: Secondary | ICD-10-CM

## 2020-10-11 DIAGNOSIS — H1011 Acute atopic conjunctivitis, right eye: Secondary | ICD-10-CM

## 2020-10-25 ENCOUNTER — Other Ambulatory Visit: Payer: Self-pay | Admitting: Family

## 2020-10-25 DIAGNOSIS — Z1231 Encounter for screening mammogram for malignant neoplasm of breast: Secondary | ICD-10-CM

## 2020-11-16 ENCOUNTER — Telehealth: Admitting: Physician Assistant

## 2020-11-16 DIAGNOSIS — R059 Cough, unspecified: Secondary | ICD-10-CM

## 2020-11-16 DIAGNOSIS — J208 Acute bronchitis due to other specified organisms: Secondary | ICD-10-CM

## 2020-11-16 MED ORDER — BENZONATATE 100 MG PO CAPS: 100.0000 mg | ORAL_CAPSULE | Freq: Three times a day (TID) | ORAL | 0 refills | Status: DC | PRN

## 2020-11-16 NOTE — Progress Notes (Signed)
We are sorry that you are not feeling well.  Here is how we plan to help!  Based on your presentation I believe you most likely have A cough due to a virus.  This is called viral bronchitis and is best treated by rest, plenty of fluids and control of the cough.  You may use Ibuprofen or Tylenol as directed to help your symptoms.     In addition you may use A non-prescription cough medication called Robitussin DAC. Take 2 teaspoons every 8 hours or Delsym: take 2 teaspoons every 12 hours. and A prescription cough medication called Tessalon Perles 100mg . You may take 1-2 capsules every 8 hours as needed for your cough.  From your responses in the eVisit questionnaire you describe inflammation in the upper respiratory tract which is causing a significant cough.  This is commonly called Bronchitis and has four common causes:    Allergies  Viral Infections  Acid Reflux  Bacterial Infection Allergies, viruses and acid reflux are treated by controlling symptoms or eliminating the cause. An example might be a cough caused by taking certain blood pressure medications. You stop the cough by changing the medication. Another example might be a cough caused by acid reflux. Controlling the reflux helps control the cough.  USE OF BRONCHODILATOR ("RESCUE") INHALERS: There is a risk from using your bronchodilator too frequently.  The risk is that over-reliance on a medication which only relaxes the muscles surrounding the breathing tubes can reduce the effectiveness of medications prescribed to reduce swelling and congestion of the tubes themselves.  Although you feel brief relief from the bronchodilator inhaler, your asthma may actually be worsening with the tubes becoming more swollen and filled with mucus.  This can delay other crucial treatments, such as oral steroid medications. If you need to use a bronchodilator inhaler daily, several times per day, you should discuss this with your provider.  There are  probably better treatments that could be used to keep your asthma under control.     HOME CARE . Only take medications as instructed by your medical team. . Complete the entire course of an antibiotic. . Drink plenty of fluids and get plenty of rest. . Avoid close contacts especially the very young and the elderly . Cover your mouth if you cough or cough into your sleeve. . Always remember to wash your hands . A steam or ultrasonic humidifier can help congestion.   GET HELP RIGHT AWAY IF: . You develop worsening fever. . You become short of breath . You cough up blood. . Your symptoms persist after you have completed your treatment plan MAKE SURE YOU   Understand these instructions.  Will watch your condition.  Will get help right away if you are not doing well or get worse.  Your e-visit answers were reviewed by a board certified advanced clinical practitioner to complete your personal care plan.  Depending on the condition, your plan could have included both over the counter or prescription medications. If there is a problem please reply  once you have received a response from your provider. Your safety is important to [place].  If you have drug allergies check your prescription carefully.    You can use MyChart to ask questions about today's visit, request a non-urgent call back, or ask for a work or school excuse for 24 hours related to this e-Visit. If it has been greater than 24 hours you will need to follow up with your provider, or enter a new  e-Visit to address those concerns. You will get an e-mail in the next two days asking about your experience.  I hope that your e-visit has been valuable and will speed your recovery. Thank you for using e-visits.  Greater than 5 minutes, yet less than 10 minutes of time have been spent researching, coordinating and implementing care for this patient today.

## 2020-12-06 ENCOUNTER — Other Ambulatory Visit: Payer: Self-pay

## 2020-12-06 ENCOUNTER — Ambulatory Visit
Admission: RE | Admit: 2020-12-06 | Discharge: 2020-12-06 | Disposition: A | Discharge status: Home / Self Care | Source: Ambulatory Visit | Attending: Family | Admitting: Family

## 2020-12-06 DIAGNOSIS — Z1231 Encounter for screening mammogram for malignant neoplasm of breast: Secondary | ICD-10-CM

## 2021-02-25 ENCOUNTER — Telehealth: Payer: Self-pay

## 2021-02-25 NOTE — Telephone Encounter (Signed)
Patient has broke foot. She needs televisit scheduled for 04/21 8:10 good phone  number to call [phone]. She is already aware of the appointment just need someone to put in.

## 2021-02-25 NOTE — Telephone Encounter (Signed)
Lmtcb.

## 2021-02-25 NOTE — Telephone Encounter (Signed)
Appointment scheduled.

## 2021-02-28 ENCOUNTER — Encounter: Payer: Self-pay | Admitting: Family

## 2021-02-28 ENCOUNTER — Ambulatory Visit (INDEPENDENT_AMBULATORY_CARE_PROVIDER_SITE_OTHER): Admitting: Family

## 2021-02-28 DIAGNOSIS — S82891S Other fracture of right lower leg, sequela: Secondary | ICD-10-CM

## 2021-02-28 DIAGNOSIS — F410 Panic disorder [episodic paroxysmal anxiety] without agoraphobia: Secondary | ICD-10-CM

## 2021-02-28 DIAGNOSIS — F411 Generalized anxiety disorder: Secondary | ICD-10-CM | POA: Diagnosis not present

## 2021-02-28 DIAGNOSIS — S82891A Other fracture of right lower leg, initial encounter for closed fracture: Secondary | ICD-10-CM

## 2021-02-28 MED ORDER — BUSPIRONE HCL 5 MG PO TABS: 5.0000 mg | ORAL_TABLET | Freq: Three times a day (TID) | ORAL | 1 refills | Status: DC | PRN

## 2021-02-28 NOTE — Progress Notes (Signed)
   Virtual Visit  Note Due to COVID-19 pandemic this visit was conducted virtually. This visit type was conducted due to national recommendations for restrictions regarding the COVID-19 Pandemic (e.g. social distancing, sheltering in place) in an effort to limit this patient's exposure and mitigate transmission in our community. All issues noted in this document were discussed and addressed.  A physical exam was not performed with this format.  I connected with Nicole Benitez on 02/28/21 at 8:09 AM  by telephone and verified that I am speaking with the correct person using two identifiers. Nicole Benitez is currently located at car and husband is currently with her  during visit. The provider, Evelina Dun, FNP is located in their office at time of visit.  I discussed the limitations, risks, security and privacy concerns of performing an evaluation and management service by telephone and the availability of in person appointments. I also discussed with the patient that there may be a patient responsible charge related to this service. The patient expressed understanding and agreed to proceed.   History and Present Illness:  Pt calls with increased GAD. She reports she broke her ankle last week and is on the way to get surgery completed today. She states since this she has had increased anxiety and worry.  She reports she has had multiple panic attacks when she feels "stuck" and can not get where she needs to be.  Anxiety Presents for initial visit. Symptoms include depressed mood, excessive worry, irritability, nervous/anxious behavior and restlessness.        Review of Systems  Constitutional: Positive for irritability.  Musculoskeletal: Positive for joint pain.  Psychiatric/Behavioral: The patient is nervous/anxious.   All other systems reviewed and are negative.    Observations/Objective: No SOB or distress noted  Assessment and Plan: 1. GAD (generalized anxiety disorder) - busPIRone  (BUSPAR) 5 MG tablet; Take 1 tablet (5 mg total) by mouth 3 (three) times daily as needed.  Dispense: 90 tablet; Refill: 1  2. Panic attack - busPIRone (BUSPAR) 5 MG tablet; Take 1 tablet (5 mg total) by mouth 3 (three) times daily as needed.  Dispense: 90 tablet; Refill: 1  3. Closed fracture of right ankle, sequela   Keep ortho and surgeon follow up Will start Buspar 5 mg TID prn  Stress management discussed RTO 4-6 weeks      I discussed the assessment and treatment plan with the patient. The patient was provided an opportunity to ask questions and all were answered. The patient agreed with the plan and demonstrated an understanding of the instructions.   The patient was advised to call back or seek an in-person evaluation if the symptoms worsen or if the condition fails to improve as anticipated.  The above assessment and management plan was discussed with the patient. The patient verbalized understanding of and has agreed to the management plan. Patient is aware to call the clinic if symptoms persist or worsen. Patient is aware when to return to the clinic for a follow-up visit. Patient educated on when it is appropriate to go to the emergency department.   Time call ended:  8:20 AM   I provided 11 minutes of  non face-to-face time during this encounter.    Evelina Dun, FNP

## 2021-04-25 ENCOUNTER — Other Ambulatory Visit: Payer: Self-pay | Admitting: Family

## 2021-04-25 DIAGNOSIS — F411 Generalized anxiety disorder: Secondary | ICD-10-CM

## 2021-04-25 DIAGNOSIS — F410 Panic disorder [episodic paroxysmal anxiety] without agoraphobia: Secondary | ICD-10-CM

## 2021-07-06 ENCOUNTER — Other Ambulatory Visit: Payer: Self-pay | Admitting: Family

## 2021-07-06 DIAGNOSIS — H1011 Acute atopic conjunctivitis, right eye: Secondary | ICD-10-CM

## 2021-07-06 DIAGNOSIS — J309 Allergic rhinitis, unspecified: Secondary | ICD-10-CM

## 2021-07-08 ENCOUNTER — Other Ambulatory Visit: Payer: Self-pay | Admitting: Family

## 2021-07-08 DIAGNOSIS — F411 Generalized anxiety disorder: Secondary | ICD-10-CM

## 2021-07-08 DIAGNOSIS — F410 Panic disorder [episodic paroxysmal anxiety] without agoraphobia: Secondary | ICD-10-CM

## 2021-08-12 ENCOUNTER — Other Ambulatory Visit: Payer: Self-pay | Admitting: *Deleted

## 2021-08-12 DIAGNOSIS — E785 Hyperlipidemia, unspecified: Secondary | ICD-10-CM

## 2021-08-12 DIAGNOSIS — H1011 Acute atopic conjunctivitis, right eye: Secondary | ICD-10-CM

## 2021-08-12 DIAGNOSIS — E559 Vitamin D deficiency, unspecified: Secondary | ICD-10-CM

## 2021-08-12 DIAGNOSIS — J309 Allergic rhinitis, unspecified: Secondary | ICD-10-CM

## 2021-08-12 MED ORDER — SIMVASTATIN 20 MG PO TABS: 20.0000 mg | ORAL_TABLET | Freq: Every day | ORAL | 0 refills | Status: DC

## 2021-08-12 MED ORDER — CETIRIZINE HCL 10 MG PO TABS: 10.0000 mg | ORAL_TABLET | Freq: Every day | ORAL | 0 refills | Status: DC

## 2021-08-14 ENCOUNTER — Other Ambulatory Visit: Payer: Self-pay | Admitting: *Deleted

## 2021-08-14 DIAGNOSIS — F410 Panic disorder [episodic paroxysmal anxiety] without agoraphobia: Secondary | ICD-10-CM

## 2021-08-14 DIAGNOSIS — F411 Generalized anxiety disorder: Secondary | ICD-10-CM

## 2021-08-14 MED ORDER — BUSPIRONE HCL 5 MG PO TABS: 5.0000 mg | ORAL_TABLET | Freq: Three times a day (TID) | ORAL | 0 refills | Status: DC | PRN

## 2021-09-16 ENCOUNTER — Other Ambulatory Visit (HOSPITAL_COMMUNITY)
Admission: RE | Admit: 2021-09-16 | Discharge: 2021-09-16 | Disposition: A | Discharge status: Home / Self Care | Source: Ambulatory Visit | Attending: Family | Admitting: Family

## 2021-09-16 ENCOUNTER — Ambulatory Visit (INDEPENDENT_AMBULATORY_CARE_PROVIDER_SITE_OTHER)

## 2021-09-16 ENCOUNTER — Encounter: Payer: Self-pay | Admitting: Family

## 2021-09-16 ENCOUNTER — Ambulatory Visit (INDEPENDENT_AMBULATORY_CARE_PROVIDER_SITE_OTHER): Admitting: Family

## 2021-09-16 ENCOUNTER — Other Ambulatory Visit: Payer: Self-pay

## 2021-09-16 VITALS — BP 129/80 | HR 82 | Temp 98.1°F | Ht 64.0 in | Wt 143.2 lb

## 2021-09-16 DIAGNOSIS — F410 Panic disorder [episodic paroxysmal anxiety] without agoraphobia: Secondary | ICD-10-CM | POA: Insufficient documentation

## 2021-09-16 DIAGNOSIS — Z780 Asymptomatic menopausal state: Secondary | ICD-10-CM

## 2021-09-16 DIAGNOSIS — E785 Hyperlipidemia, unspecified: Secondary | ICD-10-CM

## 2021-09-16 DIAGNOSIS — Z0001 Encounter for general adult medical examination with abnormal findings: Secondary | ICD-10-CM | POA: Diagnosis not present

## 2021-09-16 DIAGNOSIS — E559 Vitamin D deficiency, unspecified: Secondary | ICD-10-CM

## 2021-09-16 DIAGNOSIS — Z0000 Encounter for general adult medical examination without abnormal findings: Secondary | ICD-10-CM | POA: Insufficient documentation

## 2021-09-16 DIAGNOSIS — Z01411 Encounter for gynecological examination (general) (routine) with abnormal findings: Secondary | ICD-10-CM

## 2021-09-16 DIAGNOSIS — F411 Generalized anxiety disorder: Secondary | ICD-10-CM | POA: Diagnosis not present

## 2021-09-16 DIAGNOSIS — Z23 Encounter for immunization: Secondary | ICD-10-CM | POA: Diagnosis not present

## 2021-09-16 DIAGNOSIS — F321 Major depressive disorder, single episode, moderate: Secondary | ICD-10-CM

## 2021-09-16 MED ORDER — VITAMIN D (ERGOCALCIFEROL) 1.25 MG (50000 UNIT) PO CAPS: 50000.0000 [IU] | ORAL_CAPSULE | ORAL | 3 refills | Status: DC

## 2021-09-16 MED ORDER — ESCITALOPRAM OXALATE 10 MG PO TABS: 10.0000 mg | ORAL_TABLET | Freq: Every day | ORAL | 3 refills | Status: DC

## 2021-09-16 MED ORDER — SIMVASTATIN 20 MG PO TABS: 20.0000 mg | ORAL_TABLET | Freq: Every day | ORAL | 3 refills | Status: DC

## 2021-09-16 MED ORDER — ESCITALOPRAM OXALATE 10 MG PO TABS: 10.0000 mg | ORAL_TABLET | Freq: Every day | ORAL | 0 refills | Status: DC

## 2021-09-16 MED ORDER — BUSPIRONE HCL 5 MG PO TABS: 5.0000 mg | ORAL_TABLET | Freq: Three times a day (TID) | ORAL | 2 refills | Status: DC | PRN

## 2021-09-16 NOTE — Progress Notes (Signed)
Subjective:    Patient ID: Nicole Benitez, female    DOB: 1966/10/16, 55 y.o.   MRN: [id number]  Chief Complaint  Patient presents with   Annual Exam    With pap   Pt presents to the office today for CPE with pap.  Gynecologic Exam The patient's pertinent negatives include no genital itching, genital odor, vaginal bleeding or vaginal discharge. The patient is experiencing no pain.  Hyperlipidemia This is a chronic problem. The current episode started more than 1 year ago. Current antihyperlipidemic treatment includes statins. The current treatment provides moderate improvement of lipids.  Anxiety Presents for follow-up visit. Symptoms include depressed mood, excessive worry, irritability, nervous/anxious behavior and restlessness. Symptoms occur most days. The severity of symptoms is moderate.    Depression        This is a chronic problem.  The current episode started more than 1 year ago.   The onset quality is gradual.   Associated symptoms include helplessness, hopelessness, irritable, restlessness and sad.  Past medical history includes anxiety.      Review of Systems  Constitutional:  Positive for irritability.  Genitourinary:  Negative for vaginal discharge.  Psychiatric/Behavioral:  Positive for depression. The patient is nervous/anxious.   All other systems reviewed and are negative.  Family History  Problem Relation Age of Onset   Hyperlipidemia Mother    Hypertension Mother    Cancer Father        back cancer   Hypertension Brother    Colon cancer Neg Hx    Colon polyps Neg Hx    Esophageal cancer Neg Hx    Rectal cancer Neg Hx    Stomach cancer Neg Hx    Breast cancer Neg Hx    Social History   Socioeconomic History   Marital status: Married    Spouse name: Not on file   Number of children: Not on file   Years of education: Not on file   Highest education level: Not on file  Occupational History   Not on file  Tobacco Use   Smoking status: Never    Smokeless tobacco: Never  Substance and Sexual Activity   Alcohol use: No    Alcohol/week: 0.0 standard drinks   Drug use: No   Sexual activity: Not on file  Other Topics Concern   Not on file  Social History Narrative   Not on file   Social Determinants of Health   Financial Resource Strain: Not on file  Food Insecurity: Not on file  Transportation Needs: Not on file  Physical Activity: Not on file  Stress: Not on file  Social Connections: Not on file       Objective:   Physical Exam Vitals reviewed.  Constitutional:      General: She is irritable. She is not in acute distress.    Appearance: She is well-developed.  HENT:     Head: Normocephalic and atraumatic.     Right Ear: Tympanic membrane normal.     Left Ear: Tympanic membrane normal.  Eyes:     Pupils: Pupils are equal, round, and reactive to light.  Neck:     Thyroid: No thyromegaly.  Cardiovascular:     Rate and Rhythm: Normal rate and regular rhythm.     Heart sounds: Normal heart sounds. No murmur heard. Pulmonary:     Effort: Pulmonary effort is normal. No respiratory distress.     Breath sounds: Normal breath sounds. No wheezing.  Chest:  Breasts:  Right: No swelling, bleeding, inverted nipple, mass, nipple discharge, skin change or tenderness.     Left: No swelling, bleeding, inverted nipple, mass, nipple discharge, skin change or tenderness.  Abdominal:     General: Bowel sounds are normal. There is no distension.     Palpations: Abdomen is soft.     Tenderness: There is no abdominal tenderness.  Genitourinary:    General: Normal vulva.     Comments: Bimanual exam- no adnexal masses or tenderness, ovaries nonpalpable   Cervix parous and pink- No discharge  Musculoskeletal:        General: No tenderness. Normal range of motion.     Cervical back: Normal range of motion and neck supple.  Skin:    General: Skin is warm and dry.  Neurological:     Mental Status: She is alert and oriented to  person, place, and time.     Cranial Nerves: No cranial nerve deficit.     Deep Tendon Reflexes: Reflexes are normal and symmetric.  Psychiatric:        Behavior: Behavior normal.        Thought Content: Thought content normal.        Judgment: Judgment normal.      BP 129/80   Pulse 82   Temp 98.1 F (36.7 C) (Temporal)   Ht _0  (1.626 m)   Wt 143 lb 3.2 oz (65 kg)   LMP 09/04/2015 Comment: irregular   BMI 24.58 kg/m      Assessment & Plan:  Nicole Benitez comes in today with chief complaint of Annual Exam (With pap)   Diagnosis and orders addressed:  1. GAD (generalized anxiety disorder) Start Lexapro 10 mg  Stress management  - busPIRone (BUSPAR) 5 MG tablet; Take 1 tablet (5 mg total) by mouth 3 (three) times daily as needed.  Dispense: 270 tablet; Refill: 2 - CMP14+EGFR - CBC with Differential/Platelet - escitalopram (LEXAPRO) 10 MG tablet; Take 1 tablet (10 mg total) by mouth daily.  Dispense: 90 tablet; Refill: 0  2. Panic attack - busPIRone (BUSPAR) 5 MG tablet; Take 1 tablet (5 mg total) by mouth 3 (three) times daily as needed.  Dispense: 270 tablet; Refill: 2 - CMP14+EGFR - CBC with Differential/Platelet - escitalopram (LEXAPRO) 10 MG tablet; Take 1 tablet (10 mg total) by mouth daily.  Dispense: 90 tablet; Refill: 0  3. Hyperlipidemia, unspecified hyperlipidemia type - simvastatin (ZOCOR) 20 MG tablet; Take 1 tablet (20 mg total) by mouth at bedtime.  Dispense: 90 tablet; Refill: 3 - CMP14+EGFR - CBC with Differential/Platelet  4. Vitamin D deficiency - Vitamin D, Ergocalciferol, (DRISDOL) 1.25 MG (50000 UNIT) CAPS capsule; Take 1 capsule (50,000 Units total) by mouth every 7 (seven) days.  Dispense: 12 capsule; Refill: 3 - CMP14+EGFR - CBC with Differential/Platelet - VITAMIN D 25 Hydroxy (Vit-D Deficiency, Fractures)  5. Annual physical exam - CMP14+EGFR - CBC with Differential/Platelet - Lipid panel - TSH - VITAMIN D 25 Hydroxy (Vit-D  Deficiency, Fractures) - Cytology - PAP([place])  6. Encounter for gynecological examination with abnormal finding - CMP14+EGFR - CBC with Differential/Platelet - Cytology - PAP([place])  7. Post-menopause - CMP14+EGFR - CBC with Differential/Platelet - DG [facility] DEXA  8. Depression, major, single episode, moderate (HCC) - CMP14+EGFR - CBC with Differential/Platelet - escitalopram (LEXAPRO) 10 MG tablet; Take 1 tablet (10 mg total) by mouth daily.  Dispense: 90 tablet; Refill: 0   Labs pending Health Maintenance reviewed Diet and exercise encouraged  Follow  up plan: 1 year    Evelina Dun, FNP

## 2021-09-16 NOTE — Patient Instructions (Signed)

## 2021-09-17 ENCOUNTER — Other Ambulatory Visit: Payer: Self-pay | Admitting: Family

## 2021-09-17 DIAGNOSIS — M8580 Other specified disorders of bone density and structure, unspecified site: Secondary | ICD-10-CM

## 2021-09-17 LAB — CMP14+EGFR
ALT: 26 IU/L (ref 0–32)
AST: 24 IU/L (ref 0–40)
Albumin/Globulin Ratio: 2.4 — ABNORMAL HIGH (ref 1.2–2.2)
Albumin: 4.8 g/dL (ref 3.8–4.9)
Alkaline Phosphatase: 97 IU/L (ref 44–121)
BUN/Creatinine Ratio: 17 (ref 9–23)
BUN: 12 mg/dL (ref 6–24)
Bilirubin Total: 0.5 mg/dL (ref 0.0–1.2)
CO2: 23 mmol/L (ref 20–29)
Calcium: 9.5 mg/dL (ref 8.7–10.2)
Chloride: 104 mmol/L (ref 96–106)
Creatinine, Ser: 0.72 mg/dL (ref 0.57–1.00)
Globulin, Total: 2 g/dL (ref 1.5–4.5)
Glucose: 86 mg/dL (ref 70–99)
Potassium: 4 mmol/L (ref 3.5–5.2)
Sodium: 145 mmol/L — ABNORMAL HIGH (ref 134–144)
Total Protein: 6.8 g/dL (ref 6.0–8.5)
eGFR: 97 mL/min/{1.73_m2} (ref >59)

## 2021-09-17 LAB — CBC WITH DIFFERENTIAL/PLATELET
Basophils Absolute: 0 10*3/uL (ref 0.0–0.2)
Basos: 0 %
EOS (ABSOLUTE): 0.1 10*3/uL (ref 0.0–0.4)
Eos: 1 %
Hematocrit: 39.3 % (ref 34.0–46.6)
Hemoglobin: 13.6 g/dL (ref 11.1–15.9)
Immature Grans (Abs): 0 10*3/uL (ref 0.0–0.1)
Immature Granulocytes: 0 %
Lymphocytes Absolute: 2.4 10*3/uL (ref 0.7–3.1)
Lymphs: 48 %
MCH: 33.7 pg — ABNORMAL HIGH (ref 26.6–33.0)
MCHC: 34.6 g/dL (ref 31.5–35.7)
MCV: 98 fL — ABNORMAL HIGH (ref 79–97)
Monocytes Absolute: 0.4 10*3/uL (ref 0.1–0.9)
Monocytes: 8 %
Neutrophils Absolute: 2.2 10*3/uL (ref 1.4–7.0)
Neutrophils: 43 %
Platelets: 236 10*3/uL (ref 150–450)
RBC: 4.03 x10E6/uL (ref 3.77–5.28)
RDW: 11.8 % (ref 11.7–15.4)
WBC: 5 10*3/uL (ref 3.4–10.8)

## 2021-09-17 LAB — LIPID PANEL
Chol/HDL Ratio: 2.3 ratio (ref 0.0–4.4)
Cholesterol, Total: 165 mg/dL (ref 100–199)
HDL: 71 mg/dL (ref >39)
LDL Chol Calc (NIH): 81 mg/dL (ref 0–99)
Triglycerides: 65 mg/dL (ref 0–149)
VLDL Cholesterol Cal: 13 mg/dL (ref 5–40)

## 2021-09-17 LAB — CYTOLOGY - PAP: Diagnosis: NEGATIVE

## 2021-09-17 LAB — VITAMIN D 25 HYDROXY (VIT D DEFICIENCY, FRACTURES): Vit D, 25-Hydroxy: 55.6 ng/mL (ref 30.0–100.0)

## 2021-09-17 LAB — TSH: TSH: 1.48 u[IU]/mL (ref 0.450–4.500)

## 2021-09-18 ENCOUNTER — Other Ambulatory Visit: Payer: Self-pay | Admitting: *Deleted

## 2021-09-18 DIAGNOSIS — F410 Panic disorder [episodic paroxysmal anxiety] without agoraphobia: Secondary | ICD-10-CM

## 2021-09-18 DIAGNOSIS — F411 Generalized anxiety disorder: Secondary | ICD-10-CM

## 2021-09-18 DIAGNOSIS — F321 Major depressive disorder, single episode, moderate: Secondary | ICD-10-CM

## 2021-09-18 MED ORDER — ESCITALOPRAM OXALATE 10 MG PO TABS: 10.0000 mg | ORAL_TABLET | Freq: Every day | ORAL | 3 refills | Status: DC

## 2021-09-18 NOTE — Telephone Encounter (Signed)
OV 09/16/21 rtc 1 yr

## 2021-09-19 NOTE — Telephone Encounter (Signed)
Called patient

## 2021-10-24 ENCOUNTER — Other Ambulatory Visit: Payer: Self-pay | Admitting: Family

## 2021-10-24 DIAGNOSIS — Z1231 Encounter for screening mammogram for malignant neoplasm of breast: Secondary | ICD-10-CM

## 2021-10-30 ENCOUNTER — Other Ambulatory Visit: Payer: Self-pay | Admitting: Family

## 2021-10-30 DIAGNOSIS — J309 Allergic rhinitis, unspecified: Secondary | ICD-10-CM

## 2021-10-30 DIAGNOSIS — H1011 Acute atopic conjunctivitis, right eye: Secondary | ICD-10-CM

## 2021-11-08 ENCOUNTER — Ambulatory Visit (INDEPENDENT_AMBULATORY_CARE_PROVIDER_SITE_OTHER): Admitting: Family

## 2021-11-08 ENCOUNTER — Encounter: Payer: Self-pay | Admitting: Family

## 2021-11-08 DIAGNOSIS — Z20828 Contact with and (suspected) exposure to other viral communicable diseases: Secondary | ICD-10-CM | POA: Diagnosis not present

## 2021-11-08 DIAGNOSIS — R6889 Other general symptoms and signs: Secondary | ICD-10-CM

## 2021-11-08 MED ORDER — BENZONATATE 200 MG PO CAPS: 200.0000 mg | ORAL_CAPSULE | Freq: Three times a day (TID) | ORAL | 1 refills | Status: DC | PRN

## 2021-11-08 MED ORDER — OSELTAMIVIR PHOSPHATE 75 MG PO CAPS: 75.0000 mg | ORAL_CAPSULE | Freq: Two times a day (BID) | ORAL | 0 refills | Status: DC

## 2021-11-08 MED ORDER — PROMETHAZINE-DM 6.25-15 MG/5ML PO SYRP: 5.0000 mL | ORAL_SOLUTION | Freq: Three times a day (TID) | ORAL | 0 refills | Status: DC | PRN

## 2021-11-08 NOTE — Progress Notes (Signed)
Virtual Visit  Note Due to COVID-19 pandemic this visit was conducted virtually. This visit type was conducted due to national recommendations for restrictions regarding the COVID-19 Pandemic (e.g. social distancing, sheltering in place) in an effort to limit this patient's exposure and mitigate transmission in our community. All issues noted in this document were discussed and addressed.  A physical exam was not performed with this format.  I connected with Nicole Benitez on 11/08/21 at 1:48 pm  by telephone and verified that I am speaking with the correct person using two identifiers. Nicole Benitez is currently located at home and no one is currently with her during visit. The provider, Evelina Dun, FNP is located in their office at time of visit.  I discussed the limitations, risks, security and privacy concerns of performing an evaluation and management service by telephone and the availability of in person appointments. I also discussed with the patient that there may be a patient responsible charge related to this service. The patient expressed understanding and agreed to proceed.   Nicole Benitez, Nicole Benitez are scheduled for a virtual visit with your provider today.    Just as we do with appointments in the office, we must obtain your consent to participate.  Your consent will be active for this visit and any virtual visit you may have with one of our providers in the next 365 days.    If you have a MyChart account, I can also send a copy of this consent to you electronically.  All virtual visits are billed to your insurance company just like a traditional visit in the office.  As this is a virtual visit, video technology does not allow for your provider to perform a traditional examination.  This may limit your provider's ability to fully assess your condition.  If your provider identifies any concerns that need to be evaluated in person or the need to arrange testing such as labs, EKG, etc, we will make  arrangements to do so.    Although advances in technology are sophisticated, we cannot ensure that it will always work on either your end or our end.  If the connection with a video visit is poor, we may have to switch to a telephone visit.  With either a video or telephone visit, we are not always able to ensure that we have a secure connection.   I need to obtain your verbal consent now.   Are you willing to proceed with your visit today?   Nicole Benitez has provided verbal consent on 11/08/2021 for a virtual visit (video or telephone).   Evelina Dun,  11/08/2021  1:49 PM    History and Present Illness:  PT calls the office today with flu like symptoms that started yesterday. Her daughter was diagnosed with flu on Wednesday.  Influenza This is a new problem. The current episode started yesterday. The problem has been gradually improving. Associated symptoms include chills, congestion, coughing, fatigue, a fever, headaches, myalgias and a sore throat. She has tried acetaminophen for the symptoms. The treatment provided mild relief.     Review of Systems  Constitutional:  Positive for chills, fatigue and fever.  HENT:  Positive for congestion and sore throat.   Respiratory:  Positive for cough.   Musculoskeletal:  Positive for myalgias.  Neurological:  Positive for headaches.    Observations/Objective: No SOB or distress noted   Assessment and Plan: 1. Flu-like symptoms - oseltamivir (TAMIFLU) 75 MG capsule; Take 1 capsule (75  mg total) by mouth 2 (two) times daily.  Dispense: 10 capsule; Refill: 0 - benzonatate (TESSALON) 200 MG capsule; Take 1 capsule (200 mg total) by mouth 3 (three) times daily as needed.  Dispense: 30 capsule; Refill: 1 - promethazine-dextromethorphan (PROMETHAZINE-DM) 6.25-15 MG/5ML syrup; Take 5 mLs by mouth 3 (three) times daily as needed for cough.  Dispense: 118 mL; Refill: 0  2. Exposure to influenza - oseltamivir (TAMIFLU) 75 MG capsule; Take 1  capsule (75 mg total) by mouth 2 (two) times daily.  Dispense: 10 capsule; Refill: 0 - benzonatate (TESSALON) 200 MG capsule; Take 1 capsule (200 mg total) by mouth 3 (three) times daily as needed.  Dispense: 30 capsule; Refill: 1 - promethazine-dextromethorphan (PROMETHAZINE-DM) 6.25-15 MG/5ML syrup; Take 5 mLs by mouth 3 (three) times daily as needed for cough.  Dispense: 118 mL; Refill: 0  \Start Tamiflu today Rest Force fluids Tylenol as needed Follow up if symptoms worsen or do not improve   I discussed the assessment and treatment plan with the patient. The patient was provided an opportunity to ask questions and all were answered. The patient agreed with the plan and demonstrated an understanding of the instructions.   The patient was advised to call back or seek an in-person evaluation if the symptoms worsen or if the condition fails to improve as anticipated.  The above assessment and management plan was discussed with the patient. The patient verbalized understanding of and has agreed to the management plan. Patient is aware to call the clinic if symptoms persist or worsen. Patient is aware when to return to the clinic for a follow-up visit. Patient educated on when it is appropriate to go to the emergency department.   Time call ended:  1:59 pm   I provided 11 minutes of  non face-to-face time during this encounter.    Evelina Dun, FNP

## 2021-12-09 ENCOUNTER — Other Ambulatory Visit: Payer: Self-pay

## 2021-12-09 ENCOUNTER — Ambulatory Visit
Admission: RE | Admit: 2021-12-09 | Discharge: 2021-12-09 | Disposition: A | Discharge status: Home / Self Care | Source: Ambulatory Visit | Attending: Family | Admitting: Family

## 2021-12-09 DIAGNOSIS — Z1231 Encounter for screening mammogram for malignant neoplasm of breast: Secondary | ICD-10-CM

## 2021-12-10 ENCOUNTER — Other Ambulatory Visit: Payer: Self-pay | Admitting: Family

## 2021-12-10 DIAGNOSIS — R928 Other abnormal and inconclusive findings on diagnostic imaging of breast: Secondary | ICD-10-CM

## 2021-12-13 ENCOUNTER — Other Ambulatory Visit: Payer: Self-pay | Admitting: Family

## 2021-12-13 DIAGNOSIS — F410 Panic disorder [episodic paroxysmal anxiety] without agoraphobia: Secondary | ICD-10-CM

## 2021-12-13 DIAGNOSIS — F411 Generalized anxiety disorder: Secondary | ICD-10-CM

## 2021-12-13 DIAGNOSIS — F321 Major depressive disorder, single episode, moderate: Secondary | ICD-10-CM

## 2022-01-01 ENCOUNTER — Other Ambulatory Visit: Payer: Self-pay

## 2022-01-01 ENCOUNTER — Ambulatory Visit
Admission: RE | Admit: 2022-01-01 | Discharge: 2022-01-01 | Disposition: A | Discharge status: Home / Self Care | Source: Ambulatory Visit | Attending: Family | Admitting: Family

## 2022-01-01 DIAGNOSIS — R928 Other abnormal and inconclusive findings on diagnostic imaging of breast: Secondary | ICD-10-CM

## 2022-03-17 ENCOUNTER — Ambulatory Visit (INDEPENDENT_AMBULATORY_CARE_PROVIDER_SITE_OTHER): Admitting: Emergency Medicine

## 2022-03-17 DIAGNOSIS — Z23 Encounter for immunization: Secondary | ICD-10-CM | POA: Diagnosis not present

## 2022-03-30 ENCOUNTER — Other Ambulatory Visit: Payer: Self-pay | Admitting: Family

## 2022-03-30 DIAGNOSIS — J309 Allergic rhinitis, unspecified: Secondary | ICD-10-CM

## 2022-03-30 DIAGNOSIS — H1011 Acute atopic conjunctivitis, right eye: Secondary | ICD-10-CM

## 2022-09-06 ENCOUNTER — Other Ambulatory Visit: Payer: Self-pay | Admitting: Family

## 2022-09-06 DIAGNOSIS — H1011 Acute atopic conjunctivitis, right eye: Secondary | ICD-10-CM

## 2022-09-06 DIAGNOSIS — J309 Allergic rhinitis, unspecified: Secondary | ICD-10-CM

## 2022-09-06 DIAGNOSIS — E559 Vitamin D deficiency, unspecified: Secondary | ICD-10-CM

## 2022-09-17 IMAGING — MG MM DIGITAL SCREENING BILAT W/ TOMO AND CAD
8 series · 8 of 24 positions shown · non-contrast
Comparison: Previous exam(s).

CLINICAL DATA: Screening.

EXAM:
DIGITAL SCREENING BILATERAL MAMMOGRAM WITH TOMOSYNTHESIS AND CAD
TECHNIQUE: Bilateral screening digital craniocaudal and mediolateral oblique
mammograms were obtained. Bilateral screening digital breast
tomosynthesis was performed. The images were evaluated with
computer-aided detection.

[R MLO synth-2D]
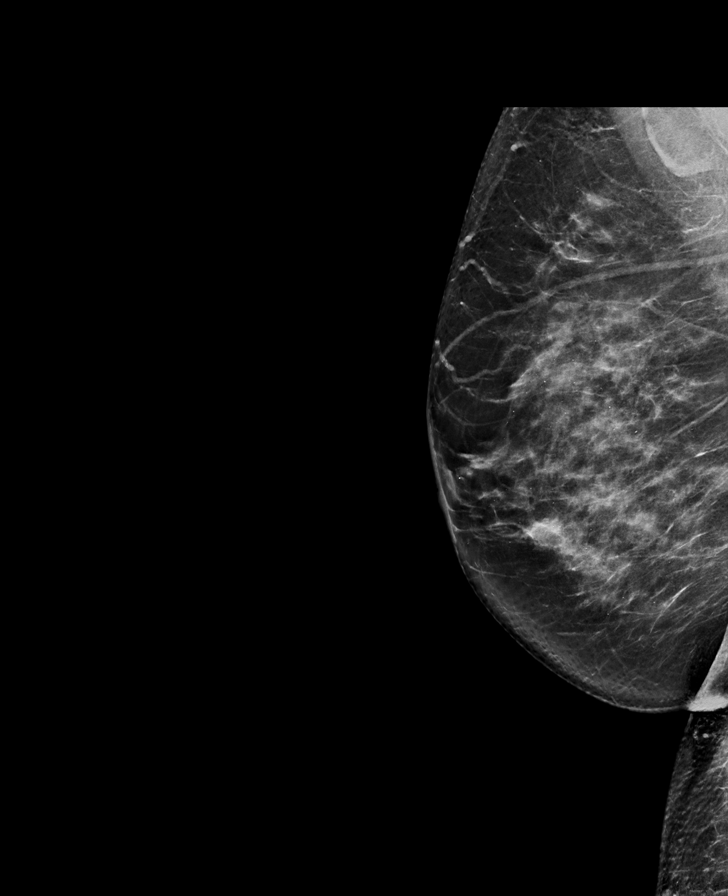

[R CC synth-2D]
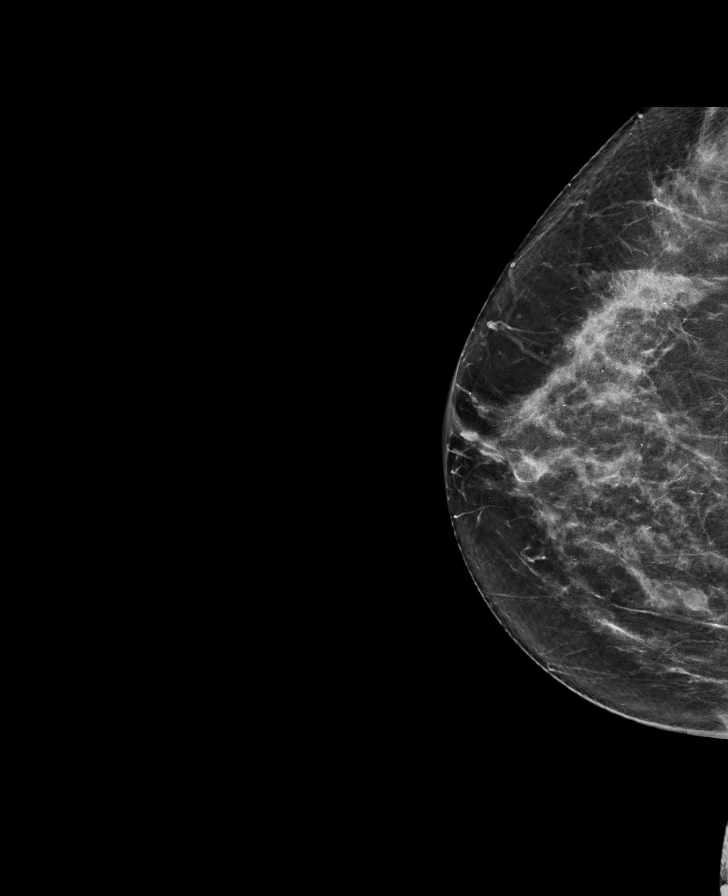

[L MLO synth-2D]
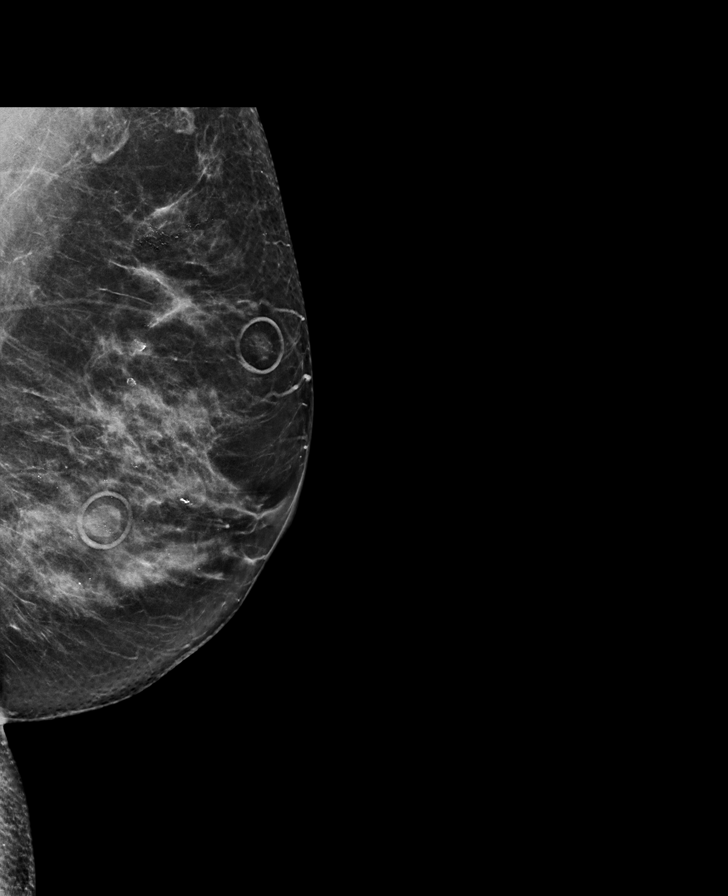

[L CC synth-2D]
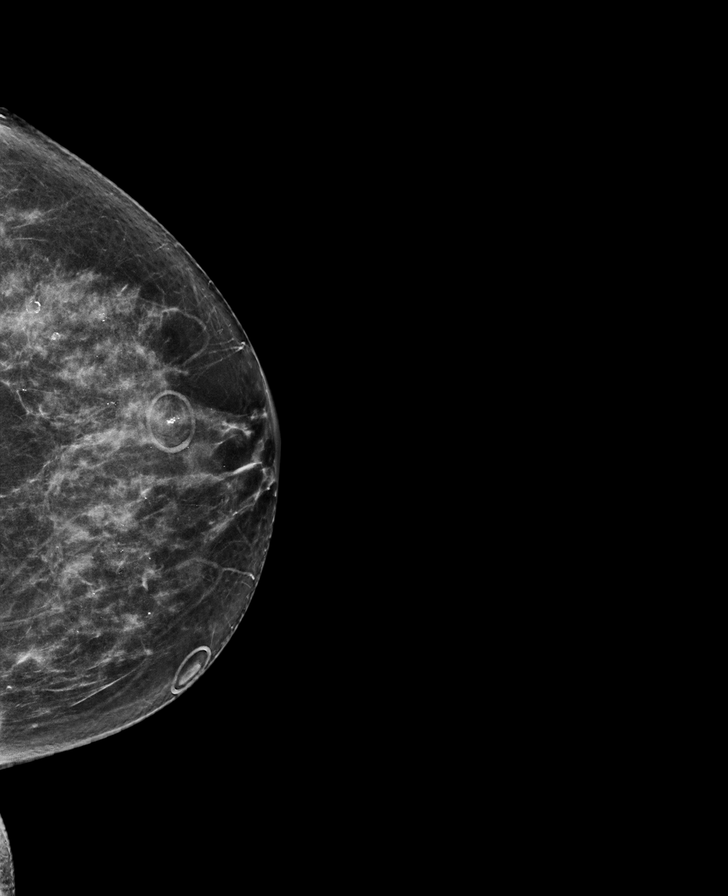

[L MLO tomo · tomo slice 39/78.0]
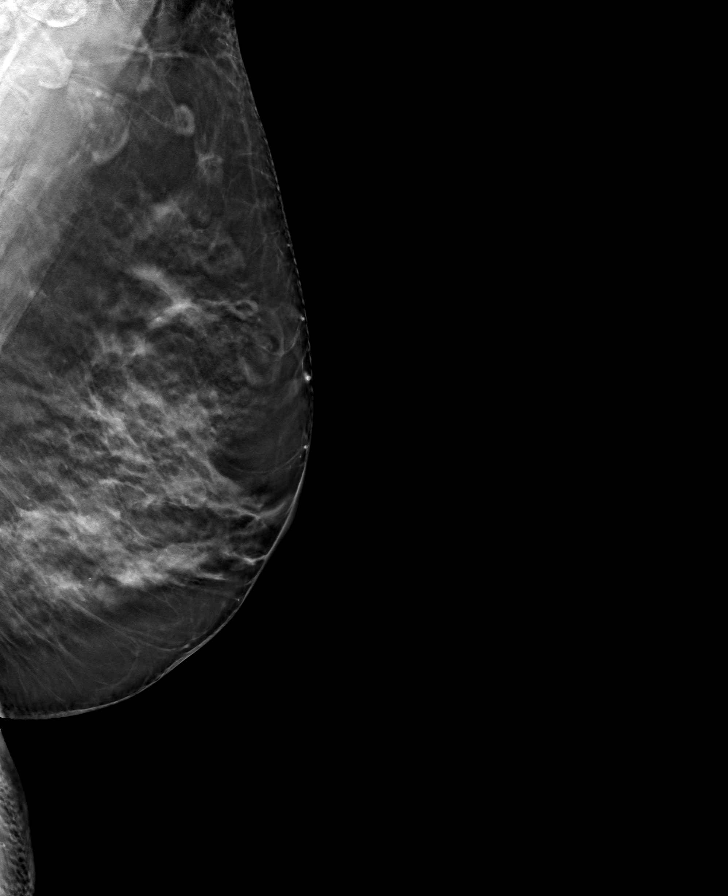

[R CC tomo · tomo slice 37/73.0]
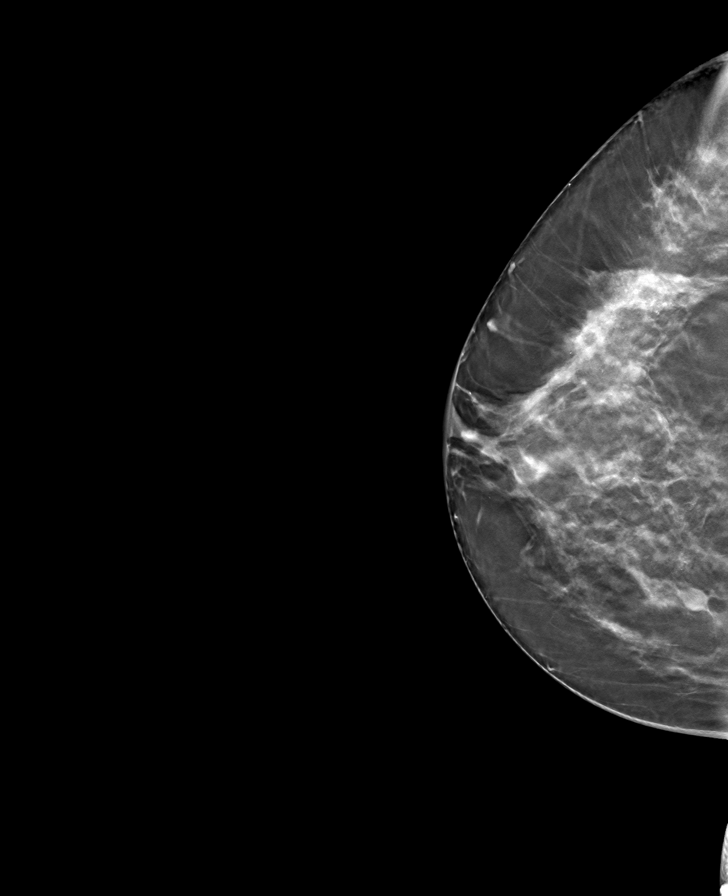

[L CC tomo · tomo slice 37/74.0]
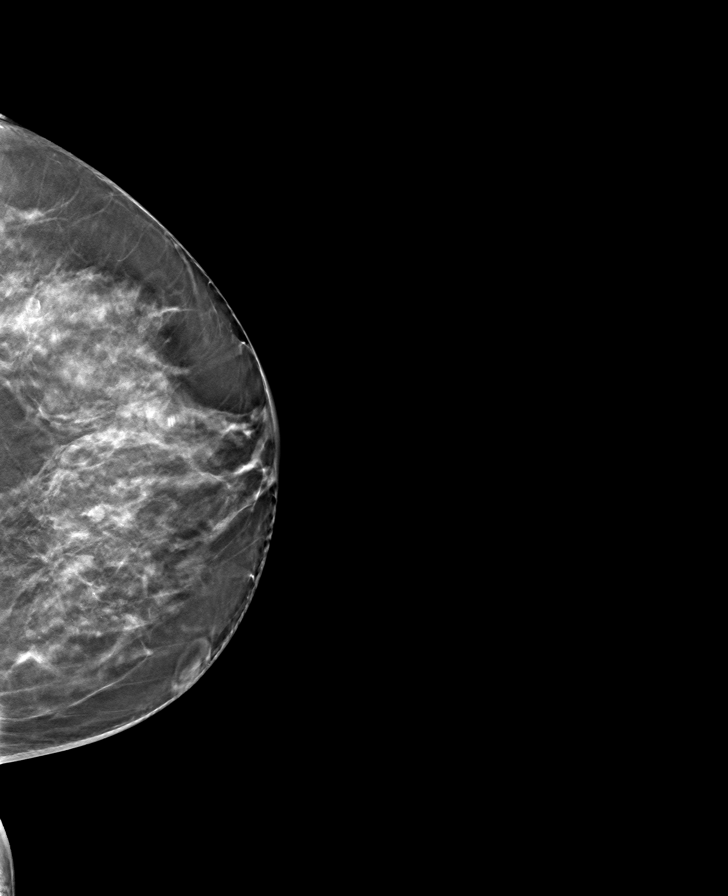

[R MLO tomo · tomo slice 41/81.0]
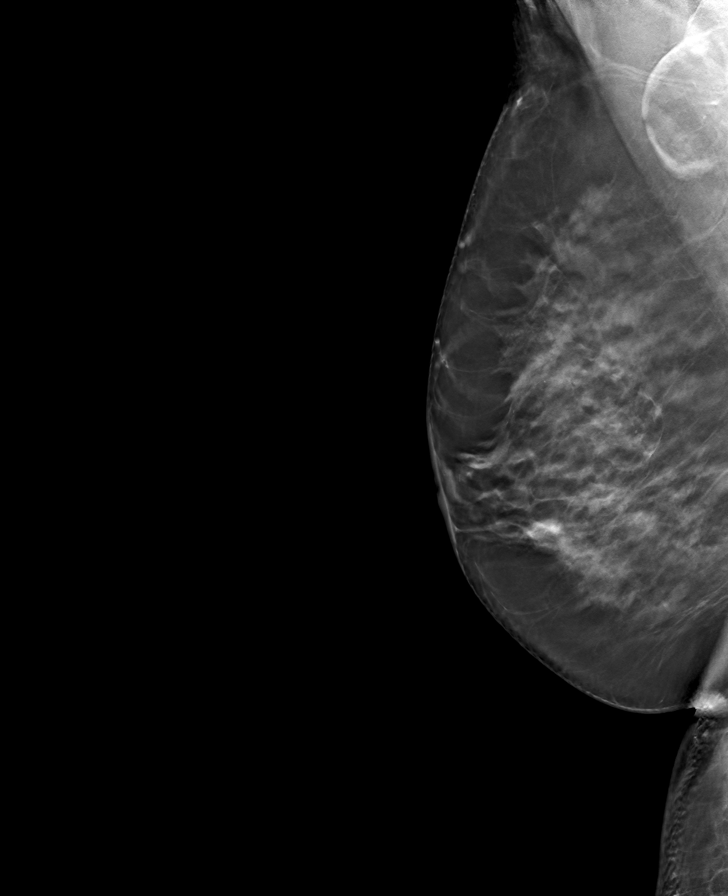

[8 of 24 positions shown; findings below may reference images not displayed]

ACR Breast Density Category c: The breast tissue is heterogeneously
dense, which may obscure small masses.
FINDINGS: In the left breast, calcifications warrant further evaluation. In
the right breast, no findings suspicious for malignancy.
IMPRESSION: Further evaluation is suggested for calcifications in the left
breast.

RECOMMENDATION:
Diagnostic mammogram of the left breast. (Code:AS-B-FFA)

The patient will be contacted regarding the findings, and additional
imaging will be scheduled.

BI-RADS CATEGORY  0: Incomplete. Need additional imaging evaluation
and/or prior mammograms for comparison.

## 2022-09-18 ENCOUNTER — Ambulatory Visit (INDEPENDENT_AMBULATORY_CARE_PROVIDER_SITE_OTHER): Admitting: Family

## 2022-09-18 ENCOUNTER — Encounter: Admitting: Family

## 2022-09-18 ENCOUNTER — Encounter: Payer: Self-pay | Admitting: Family

## 2022-09-18 VITALS — BP 130/87 | HR 87 | Temp 97.7°F | Ht 64.0 in | Wt 151.0 lb

## 2022-09-18 DIAGNOSIS — E785 Hyperlipidemia, unspecified: Secondary | ICD-10-CM | POA: Diagnosis not present

## 2022-09-18 DIAGNOSIS — Z23 Encounter for immunization: Secondary | ICD-10-CM | POA: Diagnosis not present

## 2022-09-18 DIAGNOSIS — E559 Vitamin D deficiency, unspecified: Secondary | ICD-10-CM | POA: Diagnosis not present

## 2022-09-18 DIAGNOSIS — Z0000 Encounter for general adult medical examination without abnormal findings: Secondary | ICD-10-CM

## 2022-09-18 DIAGNOSIS — M8580 Other specified disorders of bone density and structure, unspecified site: Secondary | ICD-10-CM | POA: Diagnosis not present

## 2022-09-18 DIAGNOSIS — Z0001 Encounter for general adult medical examination with abnormal findings: Secondary | ICD-10-CM | POA: Diagnosis not present

## 2022-09-18 DIAGNOSIS — F411 Generalized anxiety disorder: Secondary | ICD-10-CM

## 2022-09-18 NOTE — Patient Instructions (Addendum)
Osteopenia  Osteopenia is a loss of thickness (density) inside the bones. Another name for osteopenia is low bone mass. Mild osteopenia is a normal part of aging. It is not a disease, and it does not cause symptoms. However, if you have osteopenia and continue to lose bone mass, you could develop a condition that causes the bones to become thin and break more easily (osteoporosis). Osteoporosis can cause you to lose some height, have back pain, and have a stooped posture. Although osteopenia is not a disease, making changes to your lifestyle and diet can help to prevent osteopenia from developing into osteoporosis. What are the causes? Osteopenia is caused by loss of calcium in the bones. Bones are constantly changing. Old bone cells are continually being replaced with new bone cells. This process builds new bone. The mineral calcium is needed to build new bone and maintain bone density. Bone density is usually highest around age 35. After that, most people's bodies cannot replace all the bone they have lost with new bone. What increases the risk? You are more likely to develop this condition if: You are older than age 50. You are a woman who went through menopause early. You have a long illness that keeps you in bed. You do not get enough exercise. You lack certain nutrients (malnutrition). You have an overactive thyroid gland (hyperthyroidism). You use products that contain nicotine or tobacco, such as cigarettes, e-cigarettes and chewing tobacco, or you drink a lot of alcohol. You are taking medicines that weaken the bones, such as steroids. What are the signs or symptoms? This condition does not cause any symptoms. You may have a slightly higher risk for bone breaks (fractures), so getting fractures more easily than normal may be an indication of osteopenia. How is this diagnosed? This condition may be diagnosed based on an X-ray exam that measures bone density (dual-energy X-ray  absorptiometry, or DEXA). This test can measure bone density in your hips, spine, and wrists. Osteopenia has no symptoms, so this condition is usually diagnosed after a routine bone density screening test is done for osteoporosis. This routine screening is usually done for: Women who are age 65 or older. Men who are age 70 or older. If you have risk factors for osteopenia, you may have the screening test at an earlier age. How is this treated? Making dietary and lifestyle changes can lower your risk for osteoporosis. If you have severe osteopenia that is close to becoming osteoporosis, this condition can be treated with medicines and dietary supplements such as calcium and vitamin D. These supplements help to rebuild bone density. Follow these instructions at home: Eating and drinking Eat a diet that is high in calcium and vitamin D. Calcium is found in dairy products, beans, salmon, and leafy green vegetables like spinach and broccoli. Look for foods that have vitamin D and calcium added to them (fortified foods), such as orange juice, cereal, and bread.  Lifestyle Do 30 minutes or more of a weight-bearing exercise every day, such as walking, jogging, or playing a sport. These types of exercises strengthen the bones. Do not use any products that contain nicotine or tobacco, such as cigarettes, e-cigarettes, and chewing tobacco. If you need help quitting, ask your health care provider. Do not drink alcohol if: Your health care provider tells you not to drink. You are pregnant, may be pregnant, or are planning to become pregnant. If you drink alcohol: Limit how much you use to: 0-1 drink a day for women. 0-2   drinks a day for men. Be aware of how much alcohol is in your drink. In the [place] one drink equals one 12 oz bottle of beer (355 mL), one 5 oz glass of wine (148 mL), or one 1 oz glass of hard liquor (44 mL). General instructions Take over-the-counter and prescription medicines only as  told by your health care provider. These include vitamins and supplements. Take precautions at home to lower your risk of falling, such as: Keeping rooms well-lit and free of clutter, such as cords. Installing safety rails on stairs. Using rubber mats in the bathroom or other areas that are often wet or slippery. Keep all follow-up visits. This is important. Contact a health care provider if: You have not had a bone density screening for osteoporosis and you are: A woman who is age 45 or older. A man who is age 1 or older. You are a postmenopausal woman who has not had a bone density screening for osteoporosis. You are older than age 63 and you want to know if you should have bone density screening for osteoporosis. Summary Osteopenia is a loss of thickness (density) inside the bones. Another name for osteopenia is low bone mass. Osteopenia is not a disease, but it may increase your risk for a condition that causes the bones to become thin and break more easily (osteoporosis). You may be at risk for osteopenia if you are older than age 21 or if you are a woman who went through early menopause. Osteopenia does not cause any symptoms, but it can be diagnosed with a bone density screening test. Dietary and lifestyle changes are the first treatment for osteopenia. These may lower your risk for osteoporosis. This information is not intended to replace advice given to you by your health care provider. Make sure you discuss any questions you have with your health care provider.  Cough, Adult Coughing is a reflex that clears your throat and your airways (respiratory system). Coughing helps to heal and protect your lungs. It is normal to cough occasionally, but a cough that happens with other symptoms or lasts a long time may be a sign of a condition that needs treatment. An acute cough may only last 2-3 weeks, while a chronic cough may last 8 or more weeks. Coughing is commonly caused by: Infection  of the respiratory systemby viruses or bacteria. Breathing in substances that irritate your lungs. Allergies. Asthma. Mucus that runs down the back of your throat (postnasal drip). Smoking. Acid backing up from the stomach into the esophagus (gastroesophageal reflux). Certain medicines. Chronic lung problems. Other medical conditions such as heart failure or a blood clot in the lung (pulmonary embolism). Follow these instructions at home: Medicines Take over-the-counter and prescription medicines only as told by your health care provider. Talk with your health care provider before you take a cough suppressant medicine. Lifestyle Avoid cigarette smoke. Do not use any products that contain nicotine or tobacco, such as cigarettes, e-cigarettes, and chewing tobacco. If you need help quitting, ask your health care provider. Drink enough fluid to keep your urine pale yellow. Avoid caffeine. Do not drink alcohol if your health care provider tells you not to drink. General instructions  Pay close attention to changes in your cough. Tell your health care provider about them. Always cover your mouth when you cough. Avoid things that make you cough, such as perfume, candles, cleaning products, or campfire or tobacco smoke. If the air is dry, use a cool mist vaporizer or humidifier  in your bedroom or your home to help loosen secretions. If your cough is worse at night, try to sleep in a semi-upright position. Rest as needed. Keep all follow-up visits as told by your health care provider. This is important. Contact a health care provider if you: Have new symptoms. Cough up pus. Have a cough that does not get better after 2-3 weeks or gets worse. Cannot control your cough with cough suppressant medicines and you are losing sleep. Have pain that gets worse or pain that is not helped with medicine. Have a fever. Have unexplained weight loss. Have night sweats. Get help right away if: You cough  up blood. You have difficulty breathing. Your heartbeat is very fast. These symptoms may represent a serious problem that is an emergency. Do not wait to see if the symptoms will go away. Get medical help right away. Call your local emergency services (911 in the [place] Do not drive yourself to the hospital. Summary Coughing is a reflex that clears your throat and your airways. It is normal to cough occasionally, but a cough that happens with other symptoms or lasts a long time may be a sign of a condition that needs treatment. Take over-the-counter and prescription medicines only as told by your health care provider. Always cover your mouth when you cough. Contact a health care provider if you have new symptoms or a cough that does not get better after 2-3 weeks or gets worse. This information is not intended to replace advice given to you by your health care provider. Make sure you discuss any questions you have with your health care provider. Document Revised: 04/01/2022 Document Reviewed: 11/15/2018 Elsevier Patient Education  [place] Revised: 04/12/2020 Document Reviewed: 04/12/2020 Elsevier Patient Education  [place].

## 2022-09-18 NOTE — Progress Notes (Signed)
Subjective:    Patient ID: Nicole Benitez, female    DOB: September 01, 1966, 56 y.o.   MRN: [id number]  Chief Complaint  Patient presents with   Annual Exam   Pt presents to the office today for CPE with pap.  She has osteopenia and takes calcium and vit D daily. She also reports walking 2 miles 2-3 times a week.  Anxiety Presents for follow-up visit. Symptoms include depressed mood, excessive worry and nervous/anxious behavior. Patient reports no irritability. Symptoms occur occasionally. The severity of symptoms is mild.    Hyperlipidemia This is a chronic problem. The current episode started more than 1 year ago. The problem is controlled. Recent lipid tests were reviewed and are normal. She has no history of chronic renal disease. Current antihyperlipidemic treatment includes statins. The current treatment provides moderate improvement of lipids. Risk factors for coronary artery disease include dyslipidemia, a sedentary lifestyle and post-menopausal.      Review of Systems  Constitutional:  Negative for irritability.  Psychiatric/Behavioral:  The patient is nervous/anxious.   All other systems reviewed and are negative.   Family History  Problem Relation Age of Onset   Hyperlipidemia Mother    Hypertension Mother    Cancer Father        back cancer   Hypertension Brother    Colon cancer Neg Hx    Colon polyps Neg Hx    Esophageal cancer Neg Hx    Rectal cancer Neg Hx    Stomach cancer Neg Hx    Breast cancer Neg Hx    Social History   Socioeconomic History   Marital status: Married    Spouse name: Not on file   Number of children: Not on file   Years of education: Not on file   Highest education level: Not on file  Occupational History   Not on file  Tobacco Use   Smoking status: Never   Smokeless tobacco: Never  Substance and Sexual Activity   Alcohol use: No    Alcohol/week: 0.0 standard drinks of alcohol   Drug use: No   Sexual activity: Not on file  Other  Topics Concern   Not on file  Social History Narrative   Not on file   Social Determinants of Health   Financial Resource Strain: Not on file  Food Insecurity: Not on file  Transportation Needs: Not on file  Physical Activity: Not on file  Stress: Not on file  Social Connections: Not on file       Objective:   Physical Exam Vitals reviewed.  Constitutional:      General: She is not in acute distress.    Appearance: She is well-developed.  HENT:     Head: Normocephalic and atraumatic.     Right Ear: Tympanic membrane normal.     Left Ear: Tympanic membrane normal.  Eyes:     Pupils: Pupils are equal, round, and reactive to light.  Neck:     Thyroid: No thyromegaly.  Cardiovascular:     Rate and Rhythm: Normal rate and regular rhythm.     Heart sounds: Normal heart sounds. No murmur heard. Pulmonary:     Effort: Pulmonary effort is normal. No respiratory distress.     Breath sounds: Normal breath sounds. No wheezing.  Abdominal:     General: Bowel sounds are normal. There is no distension.     Palpations: Abdomen is soft.     Tenderness: There is no abdominal tenderness.  Musculoskeletal:  General: No tenderness. Normal range of motion.     Cervical back: Normal range of motion and neck supple.  Skin:    General: Skin is warm and dry.  Neurological:     Mental Status: She is alert and oriented to person, place, and time.     Cranial Nerves: No cranial nerve deficit.     Deep Tendon Reflexes: Reflexes are normal and symmetric.  Psychiatric:        Behavior: Behavior normal.        Thought Content: Thought content normal.        Judgment: Judgment normal.     BP 130/87   Pulse 87   Temp 97.7 F (36.5 C) (Temporal)   Ht 5' 4" (1.626 m)   Wt 151 lb (68.5 kg)   LMP 09/04/2015 Comment: irregular   SpO2 96%   BMI 25.92 kg/m        Assessment & Plan:  Nicole Benitez comes in today with chief complaint of Annual Exam   Diagnosis and orders  addressed:  1. Annual physical exam - CMP14+EGFR - CBC with Differential/Platelet - Lipid panel - TSH - VITAMIN D 25 Hydroxy (Vit-D Deficiency, Fractures)  2. Vitamin D deficiency - CMP14+EGFR - CBC with Differential/Platelet - VITAMIN D 25 Hydroxy (Vit-D Deficiency, Fractures)  3. Hyperlipidemia, unspecified hyperlipidemia type - CMP14+EGFR - CBC with Differential/Platelet - Lipid panel  4. Osteopenia, unspecified location - CMP14+EGFR - CBC with Differential/Platelet  5. GAD (generalized anxiety disorder)  - CMP14+EGFR - CBC with Differential/Platelet   Labs pending Health Maintenance reviewed Diet and exercise encouraged  Follow up plan: 1 year    Evelina Dun, FNP

## 2022-09-19 LAB — CMP14+EGFR
ALT: 29 IU/L (ref 0–32)
AST: 25 IU/L (ref 0–40)
Albumin/Globulin Ratio: 2.1 (ref 1.2–2.2)
Albumin: 4.9 g/dL (ref 3.8–4.9)
Alkaline Phosphatase: 94 IU/L (ref 44–121)
BUN/Creatinine Ratio: 22 (ref 9–23)
BUN: 17 mg/dL (ref 6–24)
Bilirubin Total: 0.5 mg/dL (ref 0.0–1.2)
CO2: 22 mmol/L (ref 20–29)
Calcium: 9.9 mg/dL (ref 8.7–10.2)
Chloride: 100 mmol/L (ref 96–106)
Creatinine, Ser: 0.77 mg/dL (ref 0.57–1.00)
Globulin, Total: 2.3 g/dL (ref 1.5–4.5)
Glucose: 87 mg/dL (ref 70–99)
Potassium: 4.4 mmol/L (ref 3.5–5.2)
Sodium: 138 mmol/L (ref 134–144)
Total Protein: 7.2 g/dL (ref 6.0–8.5)
eGFR: 89 mL/min/{1.73_m2} (ref >59)

## 2022-09-19 LAB — LIPID PANEL
Chol/HDL Ratio: 2.7 ratio (ref 0.0–4.4)
Cholesterol, Total: 191 mg/dL (ref 100–199)
HDL: 72 mg/dL (ref >39)
LDL Chol Calc (NIH): 103 mg/dL — ABNORMAL HIGH (ref 0–99)
Triglycerides: 90 mg/dL (ref 0–149)
VLDL Cholesterol Cal: 16 mg/dL (ref 5–40)

## 2022-09-19 LAB — CBC WITH DIFFERENTIAL/PLATELET
Basophils Absolute: 0 10*3/uL (ref 0.0–0.2)
Basos: 1 %
EOS (ABSOLUTE): 0.1 10*3/uL (ref 0.0–0.4)
Eos: 1 %
Hematocrit: 41.2 % (ref 34.0–46.6)
Hemoglobin: 14.1 g/dL (ref 11.1–15.9)
Immature Grans (Abs): 0 10*3/uL (ref 0.0–0.1)
Immature Granulocytes: 0 %
Lymphocytes Absolute: 3 10*3/uL (ref 0.7–3.1)
Lymphs: 46 %
MCH: 33.7 pg — ABNORMAL HIGH (ref 26.6–33.0)
MCHC: 34.2 g/dL (ref 31.5–35.7)
MCV: 99 fL — ABNORMAL HIGH (ref 79–97)
Monocytes Absolute: 0.5 10*3/uL (ref 0.1–0.9)
Monocytes: 9 %
Neutrophils Absolute: 2.7 10*3/uL (ref 1.4–7.0)
Neutrophils: 43 %
Platelets: 275 10*3/uL (ref 150–450)
RBC: 4.18 x10E6/uL (ref 3.77–5.28)
RDW: 11.8 % (ref 11.7–15.4)
WBC: 6.4 10*3/uL (ref 3.4–10.8)

## 2022-09-19 LAB — VITAMIN D 25 HYDROXY (VIT D DEFICIENCY, FRACTURES): Vit D, 25-Hydroxy: 87.7 ng/mL (ref 30.0–100.0)

## 2022-09-19 LAB — TSH: TSH: 1.47 u[IU]/mL (ref 0.450–4.500)

## 2022-10-10 IMAGING — MG DIGITAL DIAGNOSTIC UNILAT LEFT W/ CAD
3 series · 3 of 3 positions shown · non-contrast
Comparison: Previous exam(s).

CLINICAL DATA: Patient was recalled screening mammogram for left
breast calcifications.

EXAM:
DIGITAL DIAGNOSTIC UNILATERAL LEFT MAMMOGRAM WITH CAD
TECHNIQUE: Left digital diagnostic mammography was performed. Mammographic
images were processed with CAD.

[L MLO]
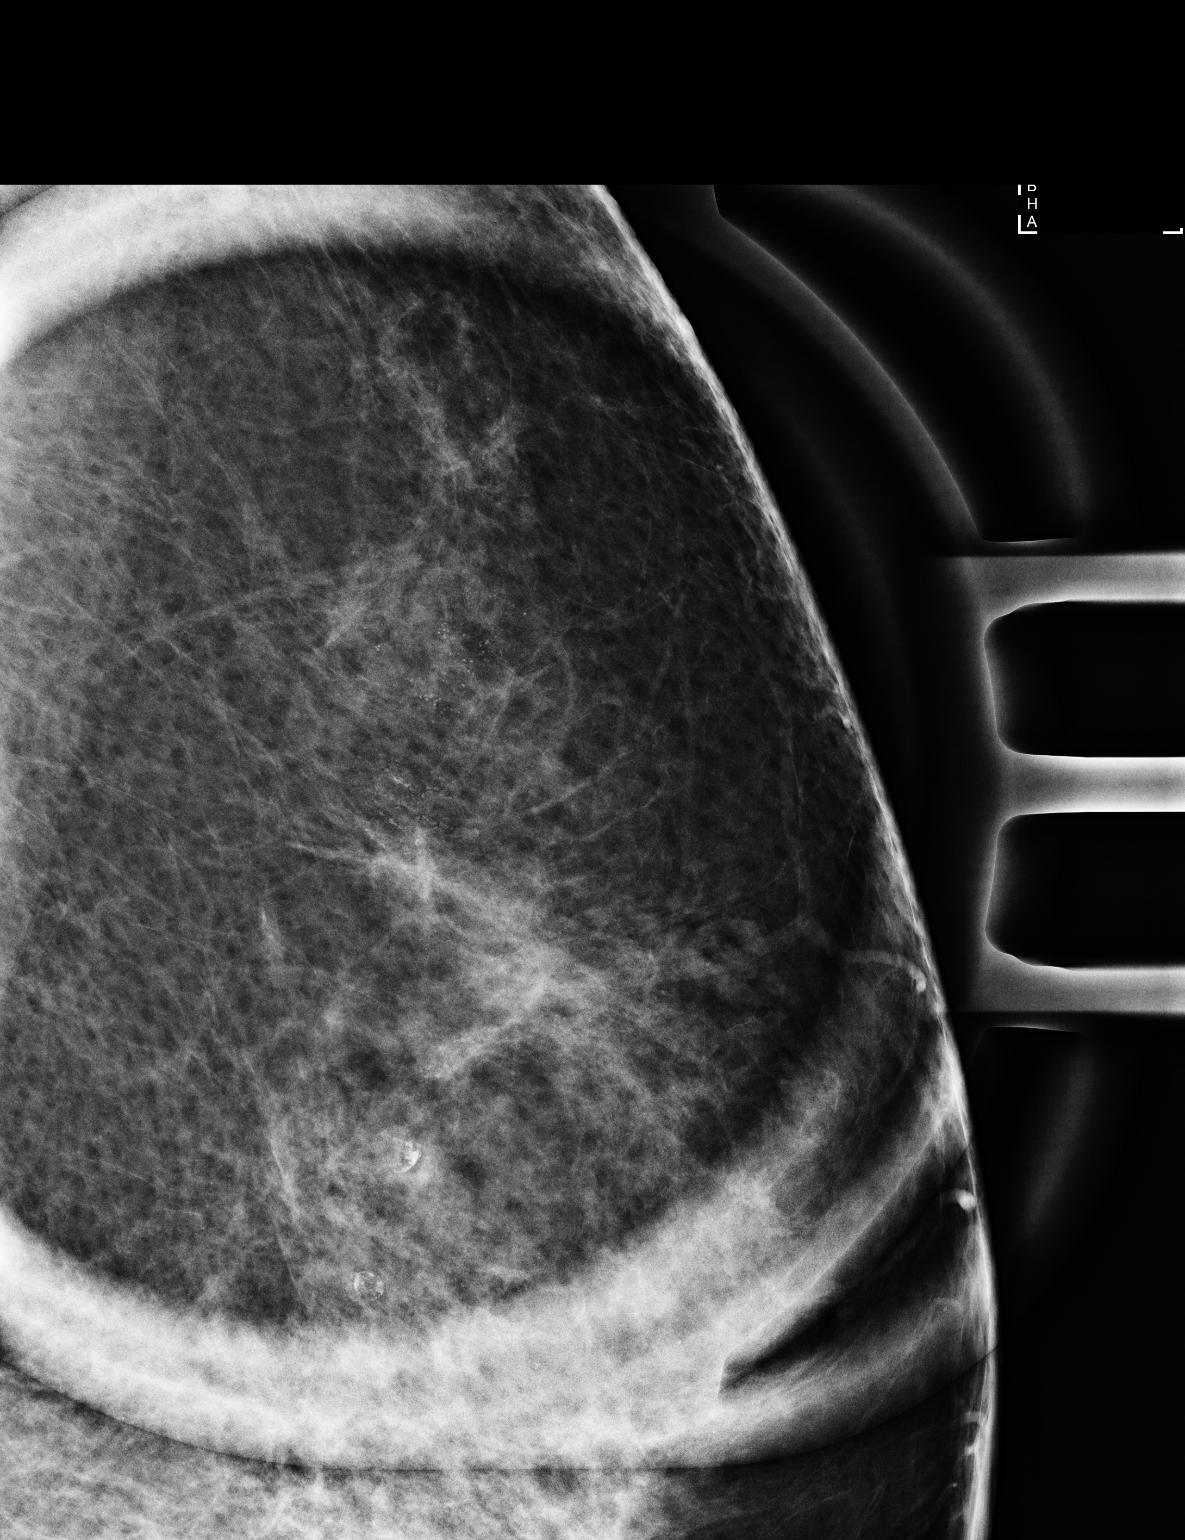

[L ML (1 of 2)]
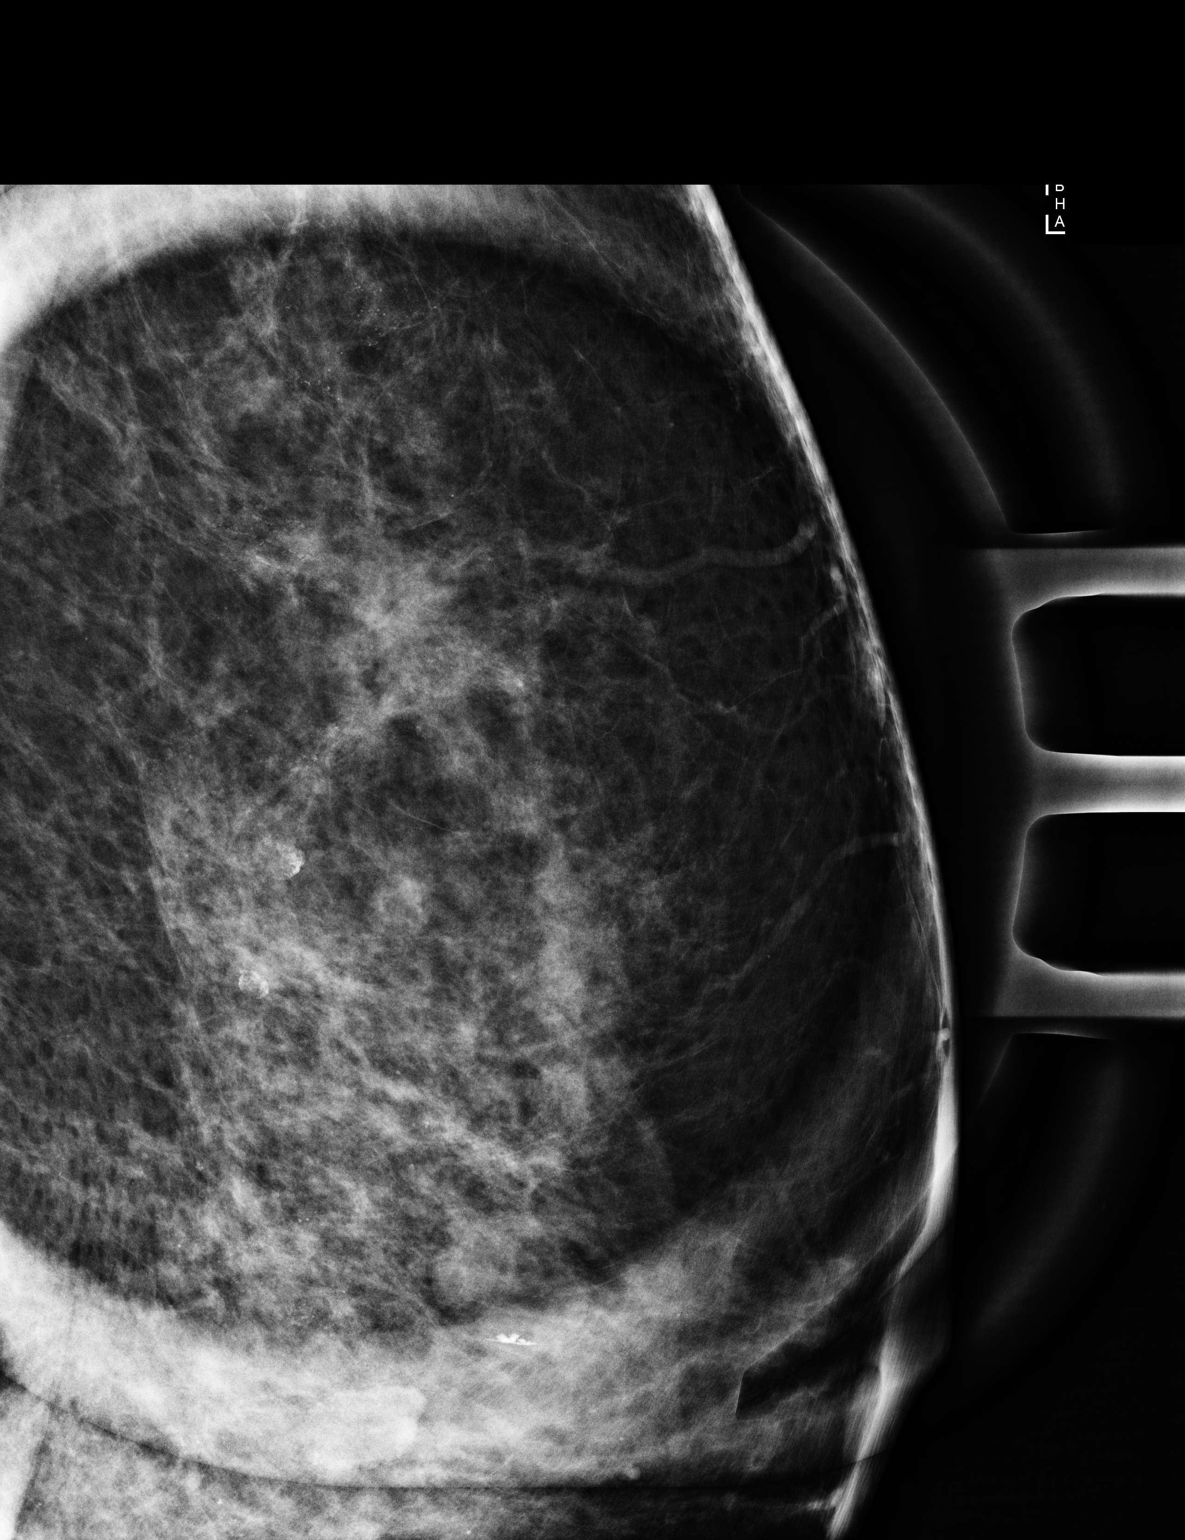

[L ML (2 of 2)]
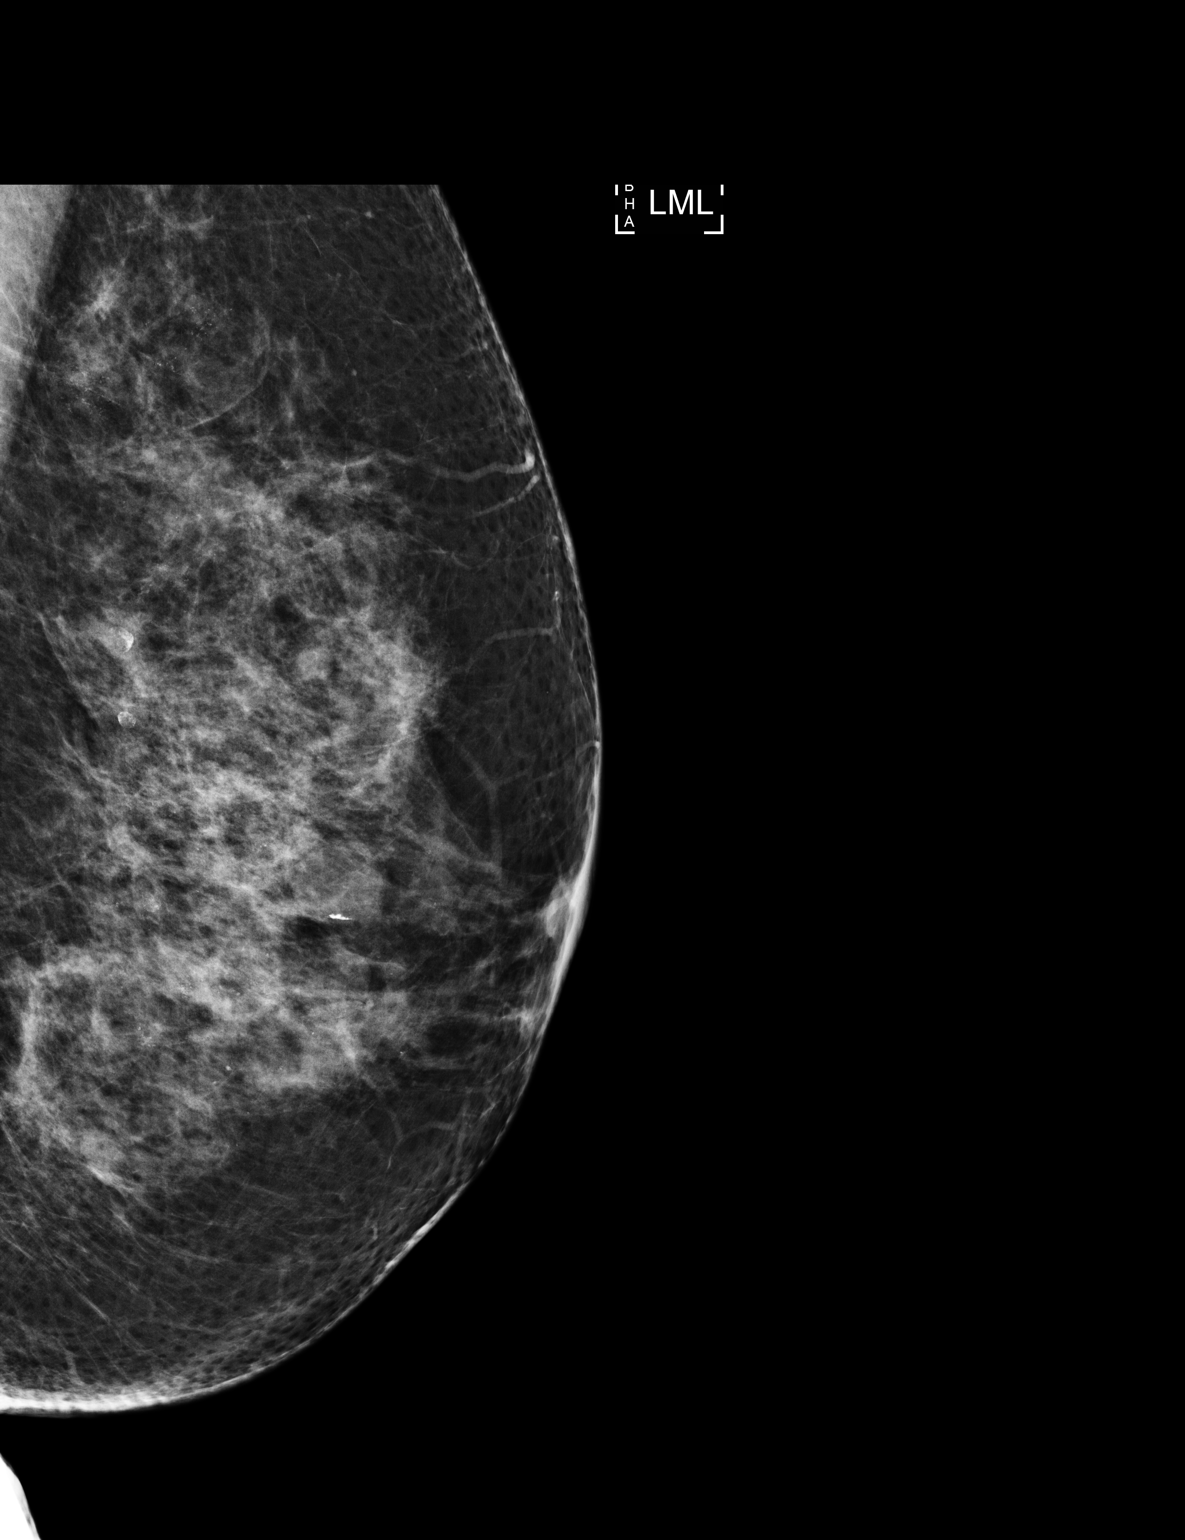

[3 of 3 positions shown; findings below may reference images not displayed]

ACR Breast Density Category c: The breast tissue is heterogeneously
dense, which may obscure small masses.
FINDINGS: Additional imaging of the left breast was performed. There are
diffuse punctate calcifications in the left breast that have a
stable appearance from prior exams. No suspicious calcifications or
mass identified.
IMPRESSION: No evidence of malignancy in the left breast.

RECOMMENDATION:
Bilateral screening mammogram in Thursday November, 2022 is recommended.

I have discussed the findings and recommendations with the patient.
If applicable, a reminder letter will be sent to the patient
regarding the next appointment.

BI-RADS CATEGORY  2: Benign.

## 2022-10-20 ENCOUNTER — Other Ambulatory Visit: Payer: Self-pay | Admitting: Family

## 2022-10-20 DIAGNOSIS — Z1231 Encounter for screening mammogram for malignant neoplasm of breast: Secondary | ICD-10-CM

## 2022-11-05 ENCOUNTER — Telehealth: Payer: Self-pay | Admitting: Family

## 2022-11-06 ENCOUNTER — Encounter: Payer: Self-pay | Admitting: Family Medicine

## 2022-11-06 ENCOUNTER — Telehealth (INDEPENDENT_AMBULATORY_CARE_PROVIDER_SITE_OTHER): Admitting: Family Medicine

## 2022-11-06 ENCOUNTER — Telehealth: Payer: Self-pay | Admitting: Family Medicine

## 2022-11-06 DIAGNOSIS — J069 Acute upper respiratory infection, unspecified: Secondary | ICD-10-CM | POA: Diagnosis not present

## 2022-11-06 MED ORDER — FLUTICASONE PROPIONATE 50 MCG/ACT NA SUSP: 2.0000 | Freq: Every day | NASAL | 6 refills | Status: DC

## 2022-11-06 MED ORDER — GUAIFENESIN ER 600 MG PO TB12: 600.0000 mg | ORAL_TABLET | Freq: Two times a day (BID) | ORAL | 0 refills | Status: AC

## 2022-11-06 MED ORDER — PREDNISONE 20 MG PO TABS: 40.0000 mg | ORAL_TABLET | Freq: Every day | ORAL | 0 refills | Status: AC

## 2022-11-06 NOTE — Progress Notes (Signed)
Virtual Visit via MyChart Video Note Due to COVID-19 pandemic this visit was conducted virtually. This visit type was conducted due to national recommendations for restrictions regarding the COVID-19 Pandemic (e.g. social distancing, sheltering in place) in an effort to limit this patient's exposure and mitigate transmission in our community. All issues noted in this document were discussed and addressed.  A physical exam was not performed with this format.   I connected with KLEE KOLEK on 11/06/2022 at 0855 by MyChart Video and verified that I am speaking with the correct person using two identifiers. Nicole Benitez is currently located at home and patient is currently with them during visit. The provider, Monia Pouch, FNP is located in their office at time of visit.  I discussed the limitations, risks, security and privacy concerns of performing an evaluation and management service by virtual visit and the availability of in person appointments. I also discussed with the patient that there may be a patient responsible charge related to this service. The patient expressed understanding and agreed to proceed.  Subjective:  Patient ID: Nicole Benitez, female    DOB: 1966-05-15, 56 y.o.   MRN: [id number]  Chief Complaint:  URI   HPI: Nicole Benitez is a 56 y.o. female presenting on 11/06/2022 for URI   Pt reports cough, congestion, rhinorrhea, myalgias, and malaise for the last 4 days. She has been taking DayQuil and NyQuil for symptom relief. Has been slightly beneficial. COVID test at home negative.   URI  This is a new problem. Episode onset: 4 days ago. The problem has been waxing and waning. There has been no fever. Associated symptoms include congestion, coughing, headaches and rhinorrhea. Pertinent negatives include no abdominal pain, chest pain, diarrhea, dysuria, ear pain, joint pain, joint swelling, nausea, neck pain, plugged ear sensation, rash, sinus pain, sneezing, sore throat,  swollen glands, vomiting or wheezing. She has tried decongestant for the symptoms. The treatment provided mild relief.     Relevant past medical, surgical, family, and social history reviewed and updated as indicated.  Allergies and medications reviewed and updated.   Past Medical History:  Diagnosis Date   Allergy    seasonal   Cancer ([place])    skin cancer back    GERD (gastroesophageal reflux disease)    occasionally but not medicated    Hyperlipidemia     Past Surgical History:  Procedure Laterality Date   SKIN CANCER EXCISION Right 11-10-2008   skin cancer rt shoulder    Social History   Socioeconomic History   Marital status: Married    Spouse name: Not on file   Number of children: Not on file   Years of education: Not on file   Highest education level: Not on file  Occupational History   Not on file  Tobacco Use   Smoking status: Never   Smokeless tobacco: Never  Substance and Sexual Activity   Alcohol use: No    Alcohol/week: 0.0 standard drinks of alcohol   Drug use: No   Sexual activity: Not on file  Other Topics Concern   Not on file  Social History Narrative   Not on file   Social Determinants of Health   Financial Resource Strain: Not on file  Food Insecurity: Not on file  Transportation Needs: Not on file  Physical Activity: Not on file  Stress: Not on file  Social Connections: Not on file  Intimate Partner Violence: Not on file    Outpatient Encounter Medications  as of 11/06/2022  Medication Sig   fluticasone (FLONASE) 50 MCG/ACT nasal spray Place 2 sprays into both nostrils daily.   guaiFENesin (MUCINEX) 600 MG 12 hr tablet Take 1 tablet (600 mg total) by mouth 2 (two) times daily for 10 days.   predniSONE (DELTASONE) 20 MG tablet Take 2 tablets (40 mg total) by mouth daily with breakfast for 5 days.   cetirizine (ZYRTEC) 10 MG tablet TAKE 1 TABLET DAILY   simvastatin (ZOCOR) 20 MG tablet Take 1 tablet (20 mg total) by mouth at bedtime.    vitamin C (ASCORBIC ACID) 500 MG tablet Take 1,000 mg by mouth daily.   Vitamin D, Ergocalciferol, (DRISDOL) 1.25 MG (50000 UNIT) CAPS capsule TAKE 1 CAPSULE EVERY 7 DAYS   vitamin E 400 UNIT capsule Take 400 Units by mouth daily.   No facility-administered encounter medications on file as of 11/06/2022.    No Known Allergies  Review of Systems  Constitutional:  Positive for activity change, appetite change, chills and fatigue. Negative for diaphoresis, fever and unexpected weight change.  HENT:  Positive for congestion, postnasal drip, rhinorrhea and sinus pressure. Negative for dental problem, drooling, ear discharge, ear pain, facial swelling, hearing loss, mouth sores, nosebleeds, sinus pain, sneezing, sore throat, tinnitus, trouble swallowing and voice change.   Respiratory:  Positive for cough. Negative for apnea, choking, chest tightness, shortness of breath, wheezing and stridor.   Cardiovascular:  Negative for chest pain.  Gastrointestinal:  Negative for abdominal pain, diarrhea, nausea and vomiting.  Genitourinary:  Negative for decreased urine volume, difficulty urinating and dysuria.  Musculoskeletal:  Positive for myalgias. Negative for arthralgias, back pain, gait problem, joint pain, joint swelling, neck pain and neck stiffness.  Skin:  Negative for rash.  Neurological:  Positive for headaches. Negative for dizziness, tremors, seizures, syncope, facial asymmetry, speech difficulty, weakness, light-headedness and numbness.  Psychiatric/Behavioral:  Negative for confusion.   All other systems reviewed and are negative.        Observations/Objective: No vital signs or physical exam, this was a virtual health encounter.  Pt alert and oriented, answers all questions appropriately, and able to speak in full sentences.    Assessment and Plan: Joy was seen today for uri.  Diagnoses and all orders for this visit:  URI with cough and congestion COVID negative. Likely  influenza but has been longer than 48 hours since symptom onset, testing not indicated as antiviral therapy will not be initiated. No indications of acute bacterial illness. Will treat symptomatically with below. Pt aware to report new, worsening, or persistent symptoms.  -     guaiFENesin (MUCINEX) 600 MG 12 hr tablet; Take 1 tablet (600 mg total) by mouth 2 (two) times daily for 10 days. -     fluticasone (FLONASE) 50 MCG/ACT nasal spray; Place 2 sprays into both nostrils daily. -     predniSONE (DELTASONE) 20 MG tablet; Take 2 tablets (40 mg total) by mouth daily with breakfast for 5 days.     Follow Up Instructions: Return if symptoms worsen or fail to improve.    I discussed the assessment and treatment plan with the patient. The patient was provided an opportunity to ask questions and all were answered. The patient agreed with the plan and demonstrated an understanding of the instructions.   The patient was advised to call back or seek an in-person evaluation if the symptoms worsen or if the condition fails to improve as anticipated.  The above assessment and management plan was  discussed with the patient. The patient verbalized understanding of and has agreed to the management plan. Patient is aware to call the clinic if they develop any new symptoms or if symptoms persist or worsen. Patient is aware when to return to the clinic for a follow-up visit. Patient educated on when it is appropriate to go to the emergency department.    I provided 12 minutes of time during this MyChart Video encounter.   Monia Pouch, FNP-C [facility] [address] [place], [place] [address] [phone] 11/06/2022

## 2022-11-06 NOTE — Telephone Encounter (Signed)
Pt called back to give Monia Pouch her home covid test result. Pt tested negative.

## 2022-11-26 ENCOUNTER — Other Ambulatory Visit: Payer: Self-pay | Admitting: Family

## 2022-11-26 DIAGNOSIS — J309 Allergic rhinitis, unspecified: Secondary | ICD-10-CM

## 2022-11-26 DIAGNOSIS — H1011 Acute atopic conjunctivitis, right eye: Secondary | ICD-10-CM

## 2022-11-26 DIAGNOSIS — E785 Hyperlipidemia, unspecified: Secondary | ICD-10-CM

## 2022-12-16 ENCOUNTER — Ambulatory Visit
Admission: RE | Admit: 2022-12-16 | Discharge: 2022-12-16 | Disposition: A | Discharge status: Home / Self Care | Source: Ambulatory Visit | Attending: Family | Admitting: Family

## 2022-12-16 DIAGNOSIS — Z1231 Encounter for screening mammogram for malignant neoplasm of breast: Secondary | ICD-10-CM

## 2022-12-20 ENCOUNTER — Other Ambulatory Visit: Payer: Self-pay | Admitting: Family

## 2022-12-20 DIAGNOSIS — E559 Vitamin D deficiency, unspecified: Secondary | ICD-10-CM

## 2023-02-05 ENCOUNTER — Encounter: Payer: Self-pay | Admitting: Family Medicine

## 2023-02-05 ENCOUNTER — Telehealth (INDEPENDENT_AMBULATORY_CARE_PROVIDER_SITE_OTHER): Admitting: Family Medicine

## 2023-02-05 DIAGNOSIS — J40 Bronchitis, not specified as acute or chronic: Secondary | ICD-10-CM | POA: Diagnosis not present

## 2023-02-05 DIAGNOSIS — J329 Chronic sinusitis, unspecified: Secondary | ICD-10-CM

## 2023-02-05 DIAGNOSIS — J069 Acute upper respiratory infection, unspecified: Secondary | ICD-10-CM

## 2023-02-05 MED ORDER — PSEUDOEPHEDRINE-GUAIFENESIN ER 120-1200 MG PO TB12: 1.0000 | ORAL_TABLET | Freq: Two times a day (BID) | ORAL | 0 refills | Status: DC

## 2023-02-05 MED ORDER — AMOXICILLIN-POT CLAVULANATE 875-125 MG PO TABS: 1.0000 | ORAL_TABLET | Freq: Two times a day (BID) | ORAL | 0 refills | Status: DC

## 2023-02-05 NOTE — Progress Notes (Signed)
Subjective:    Patient ID: [name], female    DOB: Nov 08, 1966, 57 y.o.   MRN: [id number]   HPI: NOUF SIPE is a 57 y.o. female presenting for bad cough . Awoke feeling like crud this AM. Hard to function. No fever. Cough makes her dyspneic. Coughing up white chunks of phlegm. Causes chest to hurt. Pain over bridge of nose. Not eating due to sore throat.      09/18/2022    9:25 AM 09/16/2021    9:31 AM 09/13/2020   12:43 PM 09/12/2019   12:30 PM 09/10/2018   10:13 AM  Depression screen PHQ 2/9  Decreased Interest 0 0 0 0   Down, Depressed, Hopeless 0 0 0 0 0  PHQ - 2 Score 0 0 0 0 0  Altered sleeping 0 1   0  Tired, decreased energy 0 0     Change in appetite 0 0     Feeling bad or failure about yourself  0 0     Trouble concentrating 0 0     Moving slowly or fidgety/restless 0 0     Suicidal thoughts 0 0     PHQ-9 Score 0 1   0  Difficult doing work/chores Not difficult at all Not difficult at all        Relevant past medical, surgical, family and social history reviewed and updated as indicated.  Interim medical history since our last visit reviewed. Allergies and medications reviewed and updated.  ROS:  Review of Systems  Constitutional:  Positive for activity change (malaise) and appetite change (diminished). Negative for chills and fever.  HENT:  Positive for congestion, postnasal drip, rhinorrhea and sinus pressure. Negative for ear pain, sneezing and trouble swallowing.   Respiratory:  Negative for chest tightness and shortness of breath.   Cardiovascular:  Negative for chest pain and palpitations.  Skin:  Negative for rash.  Neurological:  Positive for headaches (frontal).     Social History   Tobacco Use  Smoking Status Never  Smokeless Tobacco Never       Objective:     Wt Readings from Last 3 Encounters:  09/18/22 151 lb (68.5 kg)  09/16/21 143 lb 3.2 oz (65 kg)  09/13/20 142 lb 3.2 oz (64.5 kg)     video visit performed.   Assessment &  Plan:   1. URI with cough and congestion   2. Sinobronchitis     RSV, FLU & COVID Swab obtained  Orders Placed This Encounter  Procedures   COVID-19, Flu A+B and RSV    Order Specific Question:   Previously tested for COVID-19    Answer:   Unknown    Order Specific Question:   Resident in a congregate (group) care setting    Answer:   Unknown    Order Specific Question:   Is the patient student?    Answer:   No    Order Specific Question:   Employed in healthcare setting    Answer:   Unknown    Order Specific Question:   Pregnant    Answer:   Unknown    Order Specific Question:   Has patient completed COVID vaccination(s) (2 doses of Pfizer/Moderna 1 dose of [employer])    Answer:   Unknown      Diagnoses and all orders for this visit:  URI with cough and congestion -     COVID-19, Flu A+B and RSV  Sinobronchitis  Other  orders -     amoxicillin-clavulanate (AUGMENTIN) 875-125 MG tablet; Take 1 tablet by mouth 2 (two) times daily. Take all of this medication -     Pseudoephedrine-Guaifenesin [id number] MG TB12; Take 1 tablet by mouth 2 (two) times daily. For congestion    Virtual Visit via telephone Note  I discussed the limitations, risks, security and privacy concerns of performing an evaluation and management service by telephone and the availability of in person appointments. The patient was identified with two identifiers. Pt.expressed understanding and agreed to proceed. Pt. Is at home. Dr. Livia Snellen is in his office.  Follow Up Instructions:   I discussed the assessment and treatment plan with the patient. The patient was provided an opportunity to ask questions and all were answered. The patient agreed with the plan and demonstrated an understanding of the instructions.   The patient was advised to call back or seek an in-person evaluation if the symptoms worsen or if the condition fails to improve as anticipated.   Total minutes including chart review and  phone contact time: 12   Follow up plan: Return if symptoms worsen or fail to improve.  Claretta Fraise, MD [place]

## 2023-02-06 LAB — COVID-19, FLU A+B AND RSV
Influenza A, NAA: NOT DETECTED
Influenza B, NAA: NOT DETECTED
RSV, NAA: NOT DETECTED
SARS-CoV-2, NAA: NOT DETECTED

## 2023-06-23 ENCOUNTER — Other Ambulatory Visit: Payer: Self-pay | Admitting: Family

## 2023-06-23 DIAGNOSIS — E559 Vitamin D deficiency, unspecified: Secondary | ICD-10-CM

## 2023-09-21 ENCOUNTER — Ambulatory Visit (INDEPENDENT_AMBULATORY_CARE_PROVIDER_SITE_OTHER): Admitting: Family

## 2023-09-21 ENCOUNTER — Encounter: Payer: Self-pay | Admitting: Family

## 2023-09-21 VITALS — BP 121/84 | HR 77 | Temp 97.5°F | Ht 64.0 in | Wt 144.2 lb

## 2023-09-21 DIAGNOSIS — F411 Generalized anxiety disorder: Secondary | ICD-10-CM

## 2023-09-21 DIAGNOSIS — M8580 Other specified disorders of bone density and structure, unspecified site: Secondary | ICD-10-CM

## 2023-09-21 DIAGNOSIS — Z0001 Encounter for general adult medical examination with abnormal findings: Secondary | ICD-10-CM

## 2023-09-21 DIAGNOSIS — E785 Hyperlipidemia, unspecified: Secondary | ICD-10-CM

## 2023-09-21 DIAGNOSIS — E559 Vitamin D deficiency, unspecified: Secondary | ICD-10-CM

## 2023-09-21 DIAGNOSIS — J209 Acute bronchitis, unspecified: Secondary | ICD-10-CM

## 2023-09-21 DIAGNOSIS — Z0000 Encounter for general adult medical examination without abnormal findings: Secondary | ICD-10-CM

## 2023-09-21 MED ORDER — BENZONATATE 200 MG PO CAPS: 200.0000 mg | ORAL_CAPSULE | Freq: Three times a day (TID) | ORAL | 1 refills | Status: DC | PRN

## 2023-09-21 NOTE — Progress Notes (Signed)
Subjective:    Patient ID: Nicole Benitez, female    DOB: Jan 13, 1966, 57 y.o.   MRN: [id number]  Chief Complaint  Patient presents with   Annual Exam   Cough    X 1 week. Has not taken anything in the last 48 hours   Pt presents to the office today for CPE with pap.  She has osteopenia and takes calcium and vit D daily.  Cough This is a new problem. The current episode started 1 to 4 weeks ago. The problem has been gradually improving. The cough is Non-productive. Pertinent negatives include no chills, ear congestion, ear pain, fever, headaches, nasal congestion, sore throat, shortness of breath or wheezing. She has tried rest and OTC cough suppressant for the symptoms. The treatment provided mild relief.  Hyperlipidemia This is a chronic problem. The current episode started more than 1 year ago. The problem is controlled. Recent lipid tests were reviewed and are normal. Pertinent negatives include no shortness of breath. Current antihyperlipidemic treatment includes statins. Risk factors for coronary artery disease include dyslipidemia and a sedentary lifestyle.  Anxiety Presents for follow-up visit. Symptoms include excessive worry and nervous/anxious behavior. Patient reports no shortness of breath. Symptoms occur occasionally. The severity of symptoms is moderate.        Review of Systems  Constitutional:  Negative for chills and fever.  HENT:  Negative for ear pain and sore throat.   Respiratory:  Positive for cough. Negative for shortness of breath and wheezing.   Neurological:  Negative for headaches.  Psychiatric/Behavioral:  The patient is nervous/anxious.   All other systems reviewed and are negative.  Family History  Problem Relation Age of Onset   Hyperlipidemia Mother    Hypertension Mother    Cancer Father        back cancer   Hypertension Brother    Colon cancer Neg Hx    Colon polyps Neg Hx    Esophageal cancer Neg Hx    Rectal cancer Neg Hx    Stomach cancer  Neg Hx    Breast cancer Neg Hx    Social History   Socioeconomic History   Marital status: Married    Spouse name: Not on file   Number of children: Not on file   Years of education: Not on file   Highest education level: Not on file  Occupational History   Not on file  Tobacco Use   Smoking status: Never   Smokeless tobacco: Never  Substance and Sexual Activity   Alcohol use: No    Alcohol/week: 0.0 standard drinks of alcohol   Drug use: No   Sexual activity: Not on file  Other Topics Concern   Not on file  Social History Narrative   Not on file   Social Determinants of Health   Financial Resource Strain: Not on file  Food Insecurity: Not on file  Transportation Needs: Not on file  Physical Activity: Not on file  Stress: Stress Concern Present (02/26/2021)   Received from [facility], [facility]   [employer] [facility]ational [facility] - Occupational Stress Questionnaire    Feeling of Stress : To some extent  Social Connections: Unknown (03/23/2022)   Received from [facility], [facility]   Social Network    Social Network: Not on file       Objective:   Physical Exam Vitals reviewed.  Constitutional:      General: She is not in acute distress.    Appearance: She  is well-developed.  HENT:     Head: Normocephalic and atraumatic.     Comments: Hoarse voice     Right Ear: Tympanic membrane normal.     Left Ear: Tympanic membrane normal.  Eyes:     Pupils: Pupils are equal, round, and reactive to light.  Neck:     Thyroid: No thyromegaly.  Cardiovascular:     Rate and Rhythm: Normal rate and regular rhythm.     Heart sounds: Normal heart sounds. No murmur heard. Pulmonary:     Effort: Pulmonary effort is normal. No respiratory distress.     Breath sounds: Normal breath sounds. No wheezing.  Abdominal:     General: Bowel sounds are normal. There is no distension.     Palpations: Abdomen is soft.     Tenderness: There is no abdominal tenderness.   Musculoskeletal:        General: No tenderness. Normal range of motion.     Cervical back: Normal range of motion and neck supple.  Skin:    General: Skin is warm and dry.  Neurological:     Mental Status: She is alert and oriented to person, place, and time.     Cranial Nerves: No cranial nerve deficit.     Deep Tendon Reflexes: Reflexes are normal and symmetric.  Psychiatric:        Behavior: Behavior normal.        Thought Content: Thought content normal.        Judgment: Judgment normal.       BP 121/84   Pulse 77   Temp (!) 97.5 F (36.4 C)   Ht 5\' 4"  (1.626 m)   Wt 144 lb 3.2 oz (65.4 kg)   LMP 09/04/2015 Comment: irregular   SpO2 97%   BMI 24.75 kg/m      Assessment & Plan:  Nicole Benitez comes in today with chief complaint of Annual Exam and Cough (X 1 week. Has not taken anything in the last 48 hours)   Diagnosis and orders addressed:  1. Annual physical exam - CMP14+EGFR - CBC with Differential/Platelet - Lipid panel - VITAMIN D 25 Hydroxy (Vit-D Deficiency, Fractures)  2. GAD (generalized anxiety disorder) - CMP14+EGFR - CBC with Differential/Platelet  3. Hyperlipidemia, unspecified hyperlipidemia type - CMP14+EGFR - CBC with Differential/Platelet  4. Vitamin D deficiency - CMP14+EGFR - CBC with Differential/Platelet  5. Osteopenia, unspecified location - CMP14+EGFR - CBC with Differential/Platelet  6. Acute bronchitis, unspecified organism - Take meds as prescribed - Use a cool mist humidifier  -Use saline nose sprays frequently -Force fluids -For any cough or congestion  Use plain Mucinex- regular strength or max strength is fine -For fever or aces or pains- take tylenol or ibuprofen. -Throat lozenges if help - CMP14+EGFR - benzonatate (TESSALON) 200 MG capsule; Take 1 capsule (200 mg total) by mouth 3 (three) times daily as needed.  Dispense: 30 capsule; Refill: 1   Labs pending Continue current medications  Health Maintenance  reviewed Diet and exercise encouraged  Follow up plan: 1 year    Jannifer Rodney, FNP

## 2023-09-21 NOTE — Patient Instructions (Signed)

## 2023-09-22 LAB — LIPID PANEL
Chol/HDL Ratio: 2.7 ratio (ref 0.0–4.4)
Cholesterol, Total: 171 mg/dL (ref 100–199)
HDL: 64 mg/dL (ref >39)
LDL Chol Calc (NIH): 88 mg/dL (ref 0–99)
Triglycerides: 108 mg/dL (ref 0–149)
VLDL Cholesterol Cal: 19 mg/dL (ref 5–40)

## 2023-09-22 LAB — CBC WITH DIFFERENTIAL/PLATELET
Basophils Absolute: 0 10*3/uL (ref 0.0–0.2)
Basos: 1 %
EOS (ABSOLUTE): 0.1 10*3/uL (ref 0.0–0.4)
Eos: 1 %
Hematocrit: 41.5 % (ref 34.0–46.6)
Hemoglobin: 13.8 g/dL (ref 11.1–15.9)
Immature Grans (Abs): 0 10*3/uL (ref 0.0–0.1)
Immature Granulocytes: 0 %
Lymphocytes Absolute: 3 10*3/uL (ref 0.7–3.1)
Lymphs: 39 %
MCH: 33.7 pg — ABNORMAL HIGH (ref 26.6–33.0)
MCHC: 33.3 g/dL (ref 31.5–35.7)
MCV: 101 fL — ABNORMAL HIGH (ref 79–97)
Monocytes Absolute: 0.6 10*3/uL (ref 0.1–0.9)
Monocytes: 8 %
Neutrophils Absolute: 3.8 10*3/uL (ref 1.4–7.0)
Neutrophils: 51 %
Platelets: 294 10*3/uL (ref 150–450)
RBC: 4.1 x10E6/uL (ref 3.77–5.28)
RDW: 11.4 % — ABNORMAL LOW (ref 11.7–15.4)
WBC: 7.6 10*3/uL (ref 3.4–10.8)

## 2023-09-22 LAB — CMP14+EGFR
ALT: 28 IU/L (ref 0–32)
AST: 24 [IU]/L (ref 0–40)
Albumin: 4.7 g/dL (ref 3.8–4.9)
Alkaline Phosphatase: 111 [IU]/L (ref 44–121)
BUN/Creatinine Ratio: 23 (ref 9–23)
BUN: 17 mg/dL (ref 6–24)
Bilirubin Total: 0.3 mg/dL (ref 0.0–1.2)
CO2: 24 mmol/L (ref 20–29)
Calcium: 9.9 mg/dL (ref 8.7–10.2)
Chloride: 103 mmol/L (ref 96–106)
Creatinine, Ser: 0.74 mg/dL (ref 0.57–1.00)
Globulin, Total: 2.5 g/dL (ref 1.5–4.5)
Glucose: 88 mg/dL (ref 70–99)
Potassium: 4.5 mmol/L (ref 3.5–5.2)
Sodium: 141 mmol/L (ref 134–144)
Total Protein: 7.2 g/dL (ref 6.0–8.5)
eGFR: 93 mL/min/{1.73_m2} (ref >59)

## 2023-09-22 LAB — VITAMIN D 25 HYDROXY (VIT D DEFICIENCY, FRACTURES): Vit D, 25-Hydroxy: 79.5 ng/mL (ref 30.0–100.0)

## 2023-09-25 DIAGNOSIS — R5383 Other fatigue: Secondary | ICD-10-CM

## 2023-09-28 ENCOUNTER — Other Ambulatory Visit

## 2023-09-28 ENCOUNTER — Other Ambulatory Visit: Payer: Self-pay | Admitting: Family

## 2023-09-28 DIAGNOSIS — Z1231 Encounter for screening mammogram for malignant neoplasm of breast: Secondary | ICD-10-CM

## 2023-09-28 DIAGNOSIS — R5383 Other fatigue: Secondary | ICD-10-CM

## 2023-09-28 NOTE — Addendum Note (Signed)
Addended by: Jannifer Rodney A on: 09/28/2023 02:11 PM   Modules accepted: Orders

## 2023-09-29 LAB — IRON,TIBC AND FERRITIN PANEL
Ferritin: 330 ng/mL — ABNORMAL HIGH (ref 15–150)
Iron Saturation: 20 % (ref 15–55)
Iron: 58 ug/dL (ref 27–159)
Total Iron Binding Capacity: 294 ug/dL (ref 250–450)
UIBC: 236 ug/dL (ref 131–425)

## 2023-09-29 LAB — VITAMIN B12: Vitamin B-12: 680 pg/mL (ref 232–1245)

## 2023-11-25 ENCOUNTER — Other Ambulatory Visit: Payer: Self-pay | Admitting: Family

## 2023-11-25 DIAGNOSIS — H1011 Acute atopic conjunctivitis, right eye: Secondary | ICD-10-CM

## 2023-11-25 DIAGNOSIS — J309 Allergic rhinitis, unspecified: Secondary | ICD-10-CM

## 2023-11-25 DIAGNOSIS — E559 Vitamin D deficiency, unspecified: Secondary | ICD-10-CM

## 2023-12-18 ENCOUNTER — Ambulatory Visit
Admission: RE | Admit: 2023-12-18 | Discharge: 2023-12-18 | Disposition: A | Discharge status: Home / Self Care | Source: Ambulatory Visit | Attending: Family | Admitting: Family

## 2023-12-18 DIAGNOSIS — Z1231 Encounter for screening mammogram for malignant neoplasm of breast: Secondary | ICD-10-CM

## 2024-03-06 ENCOUNTER — Other Ambulatory Visit: Payer: Self-pay | Admitting: Family

## 2024-04-29 ENCOUNTER — Ambulatory Visit

## 2024-09-05 ENCOUNTER — Other Ambulatory Visit: Payer: Self-pay | Admitting: Family

## 2024-09-05 DIAGNOSIS — J309 Allergic rhinitis, unspecified: Secondary | ICD-10-CM

## 2024-09-05 DIAGNOSIS — E559 Vitamin D deficiency, unspecified: Secondary | ICD-10-CM

## 2024-09-05 DIAGNOSIS — H1011 Acute atopic conjunctivitis, right eye: Secondary | ICD-10-CM

## 2024-09-23 ENCOUNTER — Encounter: Payer: Self-pay | Admitting: Family

## 2024-09-23 ENCOUNTER — Ambulatory Visit (INDEPENDENT_AMBULATORY_CARE_PROVIDER_SITE_OTHER): Admitting: Family

## 2024-09-23 ENCOUNTER — Other Ambulatory Visit (HOSPITAL_COMMUNITY)
Admission: RE | Admit: 2024-09-23 | Discharge: 2024-09-23 | Disposition: A | Discharge status: Home / Self Care | Source: Ambulatory Visit | Attending: Family | Admitting: Family

## 2024-09-23 ENCOUNTER — Ambulatory Visit (INDEPENDENT_AMBULATORY_CARE_PROVIDER_SITE_OTHER)

## 2024-09-23 VITALS — BP 132/87 | HR 80 | Temp 97.5°F | Ht 64.0 in | Wt 141.8 lb

## 2024-09-23 DIAGNOSIS — E559 Vitamin D deficiency, unspecified: Secondary | ICD-10-CM | POA: Diagnosis not present

## 2024-09-23 DIAGNOSIS — M8580 Other specified disorders of bone density and structure, unspecified site: Secondary | ICD-10-CM

## 2024-09-23 DIAGNOSIS — Z01411 Encounter for gynecological examination (general) (routine) with abnormal findings: Secondary | ICD-10-CM | POA: Diagnosis not present

## 2024-09-23 DIAGNOSIS — E785 Hyperlipidemia, unspecified: Secondary | ICD-10-CM

## 2024-09-23 DIAGNOSIS — Z01419 Encounter for gynecological examination (general) (routine) without abnormal findings: Secondary | ICD-10-CM | POA: Diagnosis present

## 2024-09-23 DIAGNOSIS — Z0000 Encounter for general adult medical examination without abnormal findings: Secondary | ICD-10-CM | POA: Diagnosis present

## 2024-09-23 DIAGNOSIS — F411 Generalized anxiety disorder: Secondary | ICD-10-CM

## 2024-09-23 LAB — LIPID PANEL

## 2024-09-23 NOTE — Progress Notes (Signed)
 Subjective:    Patient ID: Nicole Benitez, female    DOB: Oct 28, 1966, 58 y.o.   MRN: [id number]  Chief Complaint  Patient presents with   Annual Exam   Pt presents to the office today for CPE with pap.    She has osteopenia and takes calcium and vit D daily.  Hyperlipidemia This is a chronic problem. The current episode started more than 1 year ago. The problem is controlled. Recent lipid tests were reviewed and are normal. Current antihyperlipidemic treatment includes statins. The current treatment provides moderate improvement of lipids. Risk factors for coronary artery disease include dyslipidemia and a sedentary lifestyle.  Anxiety Presents for follow-up visit. Symptoms include excessive worry and nervous/anxious behavior. Symptoms occur rarely. The severity of symptoms is mild.    Gynecologic Exam The patient's pertinent negatives include no genital itching, genital odor, vaginal bleeding or vaginal discharge.      Review of Systems  Genitourinary:  Negative for vaginal discharge.  Psychiatric/Behavioral:  The patient is nervous/anxious.   All other systems reviewed and are negative.  Family History  Problem Relation Age of Onset   Hyperlipidemia Mother    Hypertension Mother    Cancer Father        back cancer   Hypertension Brother    Colon cancer Neg Hx    Colon polyps Neg Hx    Esophageal cancer Neg Hx    Rectal cancer Neg Hx    Stomach cancer Neg Hx    Breast cancer Neg Hx    Social History   Socioeconomic History   Marital status: Married    Spouse name: Not on file   Number of children: Not on file   Years of education: Not on file   Highest education level: Associate degree: academic program  Occupational History   Not on file  Tobacco Use   Smoking status: Never   Smokeless tobacco: Never  Substance and Sexual Activity   Alcohol use: No    Alcohol/week: 0.0 standard drinks of alcohol   Drug use: No   Sexual activity: Not on file  Other Topics  Concern   Not on file  Social History Narrative   Not on file   Social Drivers of Health   Financial Resource Strain: Low Risk  (09/22/2024)   Overall Financial Resource Strain (CARDIA)    Difficulty of Paying Living Expenses: Not hard at all  Food Insecurity: No Food Insecurity (09/22/2024)   Hunger Vital Sign    Worried About Running Out of Food in the Last Year: Never true    Ran Out of Food in the Last Year: Never true  Transportation Needs: No Transportation Needs (09/22/2024)   PRAPARE - Administrator, Civil Service (Medical): No    Lack of Transportation (Non-Medical): No  Physical Activity: Insufficiently Active (09/22/2024)   Exercise Vital Sign    Days of Exercise per Week: 1 day    Minutes of Exercise per Session: 10 min  Stress: No Stress Concern Present (09/22/2024)   [employer] of Occupational Health - Occupational Stress Questionnaire    Feeling of Stress: Not at all  Social Connections: Moderately Integrated (09/22/2024)   Social Connection and Isolation Panel    Frequency of Communication with Friends and Family: More than three times a week    Frequency of Social Gatherings with Friends and Family: More than three times a week    Attends Religious Services: More than 4 times per year  Active Member of Clubs or Organizations: No    Attends Banker Meetings: Not on file    Marital Status: Married       Objective:   Physical Exam Vitals reviewed.  Constitutional:      General: She is not in acute distress.    Appearance: She is well-developed.  HENT:     Head: Normocephalic and atraumatic.     Comments: Hoarse voice     Right Ear: Tympanic membrane normal.     Left Ear: Tympanic membrane normal.  Eyes:     Pupils: Pupils are equal, round, and reactive to light.  Neck:     Thyroid : No thyromegaly.  Cardiovascular:     Rate and Rhythm: Normal rate and regular rhythm.     Heart sounds: Normal heart sounds. No murmur  heard. Pulmonary:     Effort: Pulmonary effort is normal. No respiratory distress.     Breath sounds: Normal breath sounds. No wheezing.  Abdominal:     General: Bowel sounds are normal. There is no distension.     Palpations: Abdomen is soft.     Tenderness: There is no abdominal tenderness.  Musculoskeletal:        General: No tenderness. Normal range of motion.     Cervical back: Normal range of motion and neck supple.  Skin:    General: Skin is warm and dry.  Neurological:     Mental Status: She is alert and oriented to person, place, and time.     Cranial Nerves: No cranial nerve deficit.     Deep Tendon Reflexes: Reflexes are normal and symmetric.  Psychiatric:        Behavior: Behavior normal.        Thought Content: Thought content normal.        Judgment: Judgment normal.       BP 132/87   Pulse 80   Temp (!) 97.5 F (36.4 C) (Temporal)   Ht 5' 4 (1.626 m)   Wt 141 lb 12.8 oz (64.3 kg)   LMP 09/04/2015 Comment: irregular   SpO2 98%   BMI 24.34 kg/m      Assessment & Plan:  Nicole Benitez comes in today with chief complaint of Annual Exam   Diagnosis and orders addressed:  1. Annual physical exam (Primary) - CMP14+EGFR - CBC with Differential/Platelet - Lipid panel - TSH - Cytology - PAP([place])  2. Vitamin D  deficiency - CMP14+EGFR - CBC with Differential/Platelet - VITAMIN D  25 Hydroxy (Vit-D Deficiency, Fractures) - DG [facility] DEXA  3. Osteopenia, unspecified location - CMP14+EGFR - CBC with Differential/Platelet - VITAMIN D  25 Hydroxy (Vit-D Deficiency, Fractures) - DG [facility] DEXA  4. Hyperlipidemia, unspecified hyperlipidemia type  - CMP14+EGFR - CBC with Differential/Platelet - Lipid panel  5. GAD (generalized anxiety disorder) - CMP14+EGFR - CBC with Differential/Platelet  6. Encounter for gynecological examination without abnormal finding - CMP14+EGFR - CBC with Differential/Platelet - Cytology - PAP([place])    Labs  pending Continue current medications  Health Maintenance reviewed Diet and exercise encouraged  Follow up plan: 1 year    Bari Learn, FNP

## 2024-09-23 NOTE — Patient Instructions (Signed)

## 2024-09-24 LAB — CBC WITH DIFFERENTIAL/PLATELET
Basophils Absolute: 0 x10E3/uL (ref 0.0–0.2)
Basos: 0 %
EOS (ABSOLUTE): 0.1 x10E3/uL (ref 0.0–0.4)
Eos: 1 %
Hematocrit: 42.2 % (ref 34.0–46.6)
Hemoglobin: 14.2 g/dL (ref 11.1–15.9)
Immature Grans (Abs): 0 x10E3/uL (ref 0.0–0.1)
Immature Granulocytes: 0 %
Lymphocytes Absolute: 2.9 x10E3/uL (ref 0.7–3.1)
Lymphs: 44 %
MCH: 33.6 pg — ABNORMAL HIGH (ref 26.6–33.0)
MCHC: 33.6 g/dL (ref 31.5–35.7)
MCV: 100 fL — ABNORMAL HIGH (ref 79–97)
Monocytes Absolute: 0.5 x10E3/uL (ref 0.1–0.9)
Monocytes: 8 %
Neutrophils Absolute: 3.1 x10E3/uL (ref 1.4–7.0)
Neutrophils: 47 %
Platelets: 328 x10E3/uL (ref 150–450)
RBC: 4.23 x10E6/uL (ref 3.77–5.28)
RDW: 11.6 % — ABNORMAL LOW (ref 11.7–15.4)
WBC: 6.7 x10E3/uL (ref 3.4–10.8)

## 2024-09-24 LAB — LIPID PANEL
Chol/HDL Ratio: 2.6 ratio (ref 0.0–4.4)
Cholesterol, Total: 160 mg/dL (ref 100–199)
HDL: 61 mg/dL (ref >39)
LDL Chol Calc (NIH): 79 mg/dL (ref 0–99)
Triglycerides: 114 mg/dL (ref 0–149)
VLDL Cholesterol Cal: 20 mg/dL (ref 5–40)

## 2024-09-24 LAB — CMP14+EGFR
ALT: 20 IU/L (ref 0–32)
AST: 20 IU/L (ref 0–40)
Albumin: 4.7 g/dL (ref 3.8–4.9)
Alkaline Phosphatase: 104 IU/L (ref 49–135)
BUN/Creatinine Ratio: 19 (ref 12–28)
BUN: 15 mg/dL (ref 8–27)
Bilirubin Total: 0.4 mg/dL (ref 0.0–1.2)
CO2: 24 mmol/L (ref 20–29)
Calcium: 9.9 mg/dL (ref 8.7–10.3)
Chloride: 102 mmol/L (ref 96–106)
Creatinine, Ser: 0.81 mg/dL (ref 0.57–1.00)
Globulin, Total: 2.5 g/dL (ref 1.5–4.5)
Glucose: 94 mg/dL (ref 70–99)
Potassium: 4.3 mmol/L (ref 3.5–5.2)
Sodium: 140 mmol/L (ref 134–144)
Total Protein: 7.2 g/dL (ref 6.0–8.5)
eGFR: 83 mL/min/1.73 (ref >59)

## 2024-09-24 LAB — TSH: TSH: 1.29 u[IU]/mL (ref 0.450–4.500)

## 2024-09-24 LAB — VITAMIN D 25 HYDROXY (VIT D DEFICIENCY, FRACTURES): Vit D, 25-Hydroxy: 104 ng/mL — ABNORMAL HIGH (ref 30.0–100.0)

## 2024-09-26 ENCOUNTER — Ambulatory Visit: Payer: Self-pay | Admitting: Family

## 2024-09-26 LAB — CYTOLOGY - PAP: Diagnosis: NEGATIVE

## 2024-09-28 LAB — IRON AND TIBC
Iron Saturation: 38 % (ref 15–55)
Iron: 111 ug/dL (ref 27–159)
Total Iron Binding Capacity: 290 ug/dL (ref 250–450)
UIBC: 179 ug/dL (ref 131–425)

## 2024-09-28 LAB — VITAMIN B12: Vitamin B-12: 675 pg/mL (ref 232–1245)

## 2024-09-28 LAB — SPECIMEN STATUS REPORT

## 2024-09-29 ENCOUNTER — Other Ambulatory Visit: Payer: Self-pay | Admitting: Family

## 2024-10-04 ENCOUNTER — Encounter

## 2024-10-04 DIAGNOSIS — J208 Acute bronchitis due to other specified organisms: Secondary | ICD-10-CM | POA: Diagnosis not present

## 2024-10-04 DIAGNOSIS — B9689 Other specified bacterial agents as the cause of diseases classified elsewhere: Secondary | ICD-10-CM

## 2024-10-04 MED ORDER — DOXYCYCLINE MONOHYDRATE 100 MG PO TABS: 100.0000 mg | ORAL_TABLET | Freq: Two times a day (BID) | ORAL | 0 refills | Status: AC

## 2024-10-04 MED ORDER — PREDNISONE 10 MG (21) PO TBPK: ORAL_TABLET | ORAL | 0 refills | Status: AC

## 2024-10-04 NOTE — Telephone Encounter (Signed)
 Hello, I have sent in doxycycline  and prednisone  to your pharmacy. I hope you feel better! Have a Happy Thanksgiving.   Bari Learn, FNP   Approximately 5 minutes was spent documenting and reviewing patient's chart.

## 2024-11-08 ENCOUNTER — Other Ambulatory Visit: Payer: Self-pay | Admitting: Family

## 2024-11-08 DIAGNOSIS — Z1231 Encounter for screening mammogram for malignant neoplasm of breast: Secondary | ICD-10-CM

## 2024-12-22 ENCOUNTER — Ambulatory Visit

## 2025-09-28 ENCOUNTER — Encounter: Admitting: Family
